# Patient Record
Sex: Female | Born: 1985 | Race: Black or African American | Hispanic: No | Marital: Married | Smoking: Never smoker
Health system: Southern US, Community
[De-identification: ages and names within clinical notes are randomized; demographics above are authoritative.]

## PROBLEM LIST (undated history)

## (undated) DIAGNOSIS — Z87440 Personal history of urinary (tract) infections: Secondary | ICD-10-CM

## (undated) DIAGNOSIS — I10 Essential (primary) hypertension: Secondary | ICD-10-CM

## (undated) DIAGNOSIS — Z87448 Personal history of other diseases of urinary system: Secondary | ICD-10-CM

## (undated) DIAGNOSIS — Z8619 Personal history of other infectious and parasitic diseases: Secondary | ICD-10-CM

## (undated) DIAGNOSIS — IMO0002 Reserved for concepts with insufficient information to code with codable children: Secondary | ICD-10-CM

## (undated) DIAGNOSIS — K219 Gastro-esophageal reflux disease without esophagitis: Secondary | ICD-10-CM

## (undated) HISTORY — DX: Personal history of urinary (tract) infections: Z87.440

## (undated) HISTORY — PX: TUBAL LIGATION: SHX77

## (undated) HISTORY — DX: Personal history of other diseases of urinary system: Z87.448

## (undated) HISTORY — DX: Personal history of other infectious and parasitic diseases: Z86.19

## (undated) HISTORY — DX: Reserved for concepts with insufficient information to code with codable children: IMO0002

## (undated) HISTORY — DX: Essential (primary) hypertension: I10

## (undated) HISTORY — DX: Gastro-esophageal reflux disease without esophagitis: K21.9

---

## 2003-05-20 ENCOUNTER — Other Ambulatory Visit: Admission: RE | Admit: 2003-05-20 | Discharge: 2003-05-20 | Payer: Self-pay | Admitting: *Deleted

## 2004-03-26 ENCOUNTER — Emergency Department (HOSPITAL_COMMUNITY): Admission: EM | Admit: 2004-03-26 | Discharge: 2004-03-26 | Payer: Self-pay | Admitting: Emergency Medicine

## 2004-07-19 ENCOUNTER — Other Ambulatory Visit: Admission: RE | Admit: 2004-07-19 | Discharge: 2004-07-19 | Payer: Self-pay | Admitting: Obstetrics and Gynecology

## 2004-10-24 ENCOUNTER — Inpatient Hospital Stay (HOSPITAL_COMMUNITY): Admission: AD | Admit: 2004-10-24 | Discharge: 2004-10-29 | Payer: Self-pay | Admitting: Obstetrics and Gynecology

## 2005-02-07 ENCOUNTER — Inpatient Hospital Stay (HOSPITAL_COMMUNITY): Admission: AD | Admit: 2005-02-07 | Discharge: 2005-02-10 | Payer: Self-pay | Admitting: Obstetrics and Gynecology

## 2005-10-06 ENCOUNTER — Emergency Department: Payer: Self-pay | Admitting: Emergency Medicine

## 2006-02-09 ENCOUNTER — Other Ambulatory Visit: Admission: RE | Admit: 2006-02-09 | Discharge: 2006-02-09 | Payer: Self-pay | Admitting: Obstetrics and Gynecology

## 2006-08-09 ENCOUNTER — Inpatient Hospital Stay (HOSPITAL_COMMUNITY): Admission: AD | Admit: 2006-08-09 | Discharge: 2006-08-12 | Payer: Self-pay | Admitting: Obstetrics and Gynecology

## 2009-04-26 ENCOUNTER — Emergency Department: Payer: Self-pay | Admitting: Unknown Physician Specialty

## 2009-12-04 ENCOUNTER — Encounter (INDEPENDENT_AMBULATORY_CARE_PROVIDER_SITE_OTHER): Payer: Self-pay | Admitting: Obstetrics and Gynecology

## 2009-12-04 ENCOUNTER — Inpatient Hospital Stay (HOSPITAL_COMMUNITY): Admission: AD | Admit: 2009-12-04 | Discharge: 2009-12-06 | Payer: Self-pay | Admitting: Obstetrics and Gynecology

## 2010-07-01 LAB — CBC
HCT: 19.2 % — ABNORMAL LOW (ref 36.0–46.0)
HCT: 20.2 % — ABNORMAL LOW (ref 36.0–46.0)
HCT: 34.9 % — ABNORMAL LOW (ref 36.0–46.0)
Hemoglobin: 11.7 g/dL — ABNORMAL LOW (ref 12.0–15.0)
Hemoglobin: 6.9 g/dL — CL (ref 12.0–15.0)
MCH: 31.6 pg (ref 26.0–34.0)
MCH: 31.8 pg (ref 26.0–34.0)
MCH: 32.2 pg (ref 26.0–34.0)
MCHC: 33.6 g/dL (ref 30.0–36.0)
MCHC: 33.6 g/dL (ref 30.0–36.0)
MCHC: 34.1 g/dL (ref 30.0–36.0)
MCV: 93.9 fL (ref 78.0–100.0)
MCV: 94.5 fL (ref 78.0–100.0)
MCV: 94.6 fL (ref 78.0–100.0)
Platelets: 181 10*3/uL (ref 150–400)
RBC: 2.03 MIL/uL — ABNORMAL LOW (ref 3.87–5.11)
RBC: 2.14 MIL/uL — ABNORMAL LOW (ref 3.87–5.11)
RBC: 3.71 MIL/uL — ABNORMAL LOW (ref 3.87–5.11)
RDW: 13.6 % (ref 11.5–15.5)
RDW: 13.9 % (ref 11.5–15.5)
RDW: 14.2 % (ref 11.5–15.5)
WBC: 10.3 10*3/uL (ref 4.0–10.5)
WBC: 12.8 10*3/uL — ABNORMAL HIGH (ref 4.0–10.5)

## 2010-07-01 LAB — COMPREHENSIVE METABOLIC PANEL
ALT: 21 U/L (ref 0–35)
AST: 19 U/L (ref 0–37)
Albumin: 3.4 g/dL — ABNORMAL LOW (ref 3.5–5.2)
Alkaline Phosphatase: 94 U/L (ref 39–117)
CO2: 23 mEq/L (ref 19–32)
Calcium: 9.3 mg/dL (ref 8.4–10.5)
Chloride: 104 mEq/L (ref 96–112)
Creatinine, Ser: 0.65 mg/dL (ref 0.4–1.2)
GFR calc Af Amer: >60 mL/min (ref >60)
Potassium: 4 mEq/L (ref 3.5–5.1)
Sodium: 135 mEq/L (ref 135–145)
Total Protein: 6.3 g/dL (ref 6.0–8.3)

## 2010-07-01 LAB — URINALYSIS, DIPSTICK ONLY
Bilirubin Urine: NEGATIVE
Glucose, UA: NEGATIVE mg/dL
Hgb urine dipstick: NEGATIVE
Ketones, ur: NEGATIVE mg/dL
Leukocytes, UA: NEGATIVE
Nitrite: NEGATIVE
Specific Gravity, Urine: 1.015 (ref 1.005–1.030)
Urobilinogen, UA: 0.2 mg/dL (ref 0.0–1.0)
pH: 6 (ref 5.0–8.0)

## 2010-07-01 LAB — URIC ACID: Uric Acid, Serum: 4.9 mg/dL (ref 2.4–7.0)
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
## 2010-09-02 NOTE — Discharge Summary (Signed)
Candice, Gonzalez              ACCOUNT NO.:  [id number]   MEDICAL RECORD NO.:  [id number]          PATIENT TYPE:  INP   LOCATION:  9160                          [facility]   PHYSICIAN:  Osborn Coho, M.D.   DATE OF BIRTH:  1985/06/02   DATE OF ADMISSION:  10/24/2004  DATE OF DISCHARGE:  10/29/2004                                 DISCHARGE SUMMARY   ADMISSION DIAGNOSIS:  1.  Intrauterine pregnancy at 24 weeks.  2.  Pyelonephritis.   DISCHARGE DIAGNOSES:  1.  Intrauterine pregnancy at 24 weeks.  2.  Pyelonephritis.   HOSPITAL COURSE:  Candice Gonzalez is a 25 year old gravida 1, para 0, who was  admitted at 24-1/7 weeks secondary to pyelonephritis. The patient had a  history of urinary tract infections this pregnancy and pyelonephritis prior  to the pregnancy. She was evaluated in the office on October 24, 2004 with  nausea, vomiting and back pain. Her urinalysis from the office showed  positive nitrites. Her temperature at home was 103. She had an elevated  blood cell count and back pain on the right. Therefore was admitted for IV  antibiotics, hydration, control of nausea and vomiting and further  observation. Her pregnancy had been followed by [facility]  service and was remarkable for:  1.  History of pyelonephritis before pregnancy.  2.  Severe nausea in early pregnancy.  3.  Urinary tract infection first trimester with patient placed on      suppressive therapy but she did not complete the full course of care.   Temperature upon admission to maternal admissions was 100.2. Blood cultures  were drawn. Urine culture and sensitivity was sent from [facility] on October 24, 2004. The patient was given Tylenol upon admission. Her  highest temperature during admission was 103.5. She was placed on a cooling  blanket. Fetal fimbriectomy was obtained on hospital day number one. On the  day of admission her laboratories revealed a white blood count 22.2  thousand, hemoglobin 11.8. On hospital day number one her white blood cell  count was 25.5 thousand and hemoglobin was 10.5. The patient was having some  irregular contractions on the monitor. Fetal heart rate was reassuring and  fetal fibronectin result was negative. By hospital day number two her white  blood count was 18.8 thousand and hemoglobin was 9.6. She remained on the  cooling blanket periodically, temperature continued to spike to 103. The  patient was continued on IV Ancef throughout her hospital stay. Occasionally  she had fetal tachycardia to the 170's.  During the night of July 13 her maximum temperature was 101.4. Urine culture  results: greater than 100,000 colonies of E. coli sensitive to Ancef. The  patient continued with intermittent back pain. By hospital day number four  she was feeling better. Positive nausea but no vomiting. At 6 a.m. on  hospital day number five she had a temperature of 100.5. White blood count  was down to 7.1 thousand and hemoglobin was 9.5. By hospital day number six  she had remained afebrile for greater than 24 hours. She was  feeling much  better. Fetal heart rate had periods of reactivity. There was no growth on  her blood cultures. The patient was deemed to have received the full benefit  of her hospital stay and she was discharged home.   DISCHARGE INSTRUCTIONS:  Preterm labor instructions were reviewed with the  patient. Perinatal hygiene as well as increased water intake were  encouraged.   DISCHARGE MEDICATIONS:  1.  Keflex 500 mg q.i.d. for a total of 14 days including the days that she      received Ancef in the hospital.  2.  Prenatal vitamin one p.o. daily.  3.  Macrodantin 100 mg q.h.s. upon completion of her Keflex for urinary      tract infection suppression therapy.   FOLLOW UP:  Discharge follow up will be occur at [facility] in  1-2 weeks or p.r.n.       KS/MEDQ  D:  10/29/2004  T:  10/29/2004  Job:   [id number]

## 2011-11-22 ENCOUNTER — Ambulatory Visit (INDEPENDENT_AMBULATORY_CARE_PROVIDER_SITE_OTHER): Payer: BC Managed Care – PPO | Admitting: Obstetrics and Gynecology

## 2011-11-22 ENCOUNTER — Encounter: Payer: Self-pay | Admitting: Obstetrics and Gynecology

## 2011-11-22 VITALS — BP 112/80 | Resp 16 | Wt 115.0 lb

## 2011-11-22 DIAGNOSIS — Z124 Encounter for screening for malignant neoplasm of cervix: Secondary | ICD-10-CM

## 2011-11-22 NOTE — Progress Notes (Signed)
Subjective:    Candice Gonzalez is a 26 y.o. female, G3P3003, who presents for an annual exam. Using condoms for birth control, not planning any more pregnancies. Not exercising, has vitamins not taking them. Is a Runner, broadcasting/film/video.    History   Social History  . Marital Status: Married    Spouse Name: N/A    Number of Children: N/A  . Years of Education: N/A   Social History Main Topics  . Smoking status: Never Smoker   . Smokeless tobacco: Never Used  . Alcohol Use: No  . Drug Use: No  . Sexually Active: Yes -- Female partner(s)    Birth Control/ Protection: Condom, Coitus interruptus     BTL   Other Topics Concern  . None   Social History Narrative  . None   Fm hx Grandmother died of breast cancer Menstrual cycle:   LMP: Patient's last menstrual period was 11/09/2011.           Cycle: regular not heavy  The following portions of the patient's history were reviewed and updated as appropriate: allergies, current medications, past family history, past medical history, past social history, past surgical history and problem list.  Review of Systems Pertinent items are noted in HPI. Breast:Negative for breast lump,nipple discharge or nipple retraction Gastrointestinal: Negative for abdominal pain, change in bowel habits or rectal bleeding Urinary:negative   Objective:    BP 112/80  Resp 16  Wt 115 lb (52.164 kg)  LMP 11/09/2011  Breastfeeding? No    Weight:  Wt Readings from Last 1 Encounters:  11/22/11 115 lb (52.164 kg)          BMI: There is no height on file to calculate BMI.  General Appearance: Alert, appropriate appearance for age. No acute distress HEENT: Grossly normal Neck / Thyroid: Supple, no masses, nodes or enlargement Lungs: clear to auscultation bilaterally Back: No CVA tenderness Breast Exam: No dimpling, nipple retraction or discharge. No masses or nodes. and No masses or nodes.No dimpling, nipple retraction or discharge. Cardiovascular: Regular rate and  rhythm. S1, S2, no murmur Gastrointestinal: Soft, non-tender, no masses or organomegaly Pelvic Exam: Vulva and vagina appear normal. Bimanual exam reveals normal uterus and adnexa. External genitalia: EGBUS WNL Cervix: normal appearance Adnexa: no masses Clinical staff offered to be present for exam: TG  Initials: TG yes present Rectovaginal: rectum visible wnl Lymphatic Exam: Non-palpable nodes in neck, clavicular, axillary, or inguinal regions Skin: no rash or abnormalities Neurologic: Normal gait and speech, no tremor  Psychiatric: Alert and oriented, appropriate affect.  Declined STD testing  Assessment:    Normal gyn exam    Plan:    pap smear sent return annually or prn STD screening: discussed Contraception:condoms, self breast exam, BRACI with fm hx, discussed routine health maintence exercise, vitamins, nutrition.  Lavera Guise, CNM

## 2011-11-22 NOTE — Progress Notes (Signed)
Last Pap: 06/2009 WNL: Yes Regular Periods:yes Contraception: CONDOMS  Monthly Breast exam:no Tetanus<76yrs:yes Nl.Bladder Function:yes Daily BMs:yes Healthy Diet:yes Calcium:no Mammogram:no Date of Mammogram: 2004-2005 ? Inflamed lymph node ? If it was breast ultrasound. Exercise:no Have often Exercise: none Seatbelt: yes Abuse at home: no Stressful work:yes Sigmoid-colonoscopy: never Bone Density: No PCP: dr. Tomasa Rand Change in PMH: no change Change in [facility]: no change  No complaints.

## 2011-11-23 LAB — PAP IG W/ RFLX HPV ASCU

## 2012-08-06 ENCOUNTER — Other Ambulatory Visit: Payer: Self-pay | Admitting: Obstetrics and Gynecology

## 2012-08-06 ENCOUNTER — Other Ambulatory Visit (HOSPITAL_COMMUNITY): Payer: Self-pay | Admitting: Obstetrics and Gynecology
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
## 2012-08-07 ENCOUNTER — Encounter (HOSPITAL_COMMUNITY): Payer: Self-pay

## 2012-08-07 ENCOUNTER — Encounter (HOSPITAL_COMMUNITY)
Admission: RE | Admit: 2012-08-07 | Discharge: 2012-08-07 | Disposition: A | Discharge status: Home / Self Care | Payer: BC Managed Care – PPO | Source: Ambulatory Visit | Attending: Obstetrics and Gynecology | Admitting: Obstetrics and Gynecology

## 2012-08-07 ENCOUNTER — Encounter (HOSPITAL_COMMUNITY): Payer: Self-pay | Admitting: Pharmacy Technician

## 2012-08-07 LAB — CBC
HCT: 41.9 % (ref 36.0–46.0)
Hemoglobin: 13.5 g/dL (ref 12.0–15.0)
MCH: 29.7 pg (ref 26.0–34.0)
MCV: 92.3 fL (ref 78.0–100.0)
Platelets: 264 10*3/uL (ref 150–400)
RBC: 4.54 MIL/uL (ref 3.87–5.11)
RDW: 13.5 % (ref 11.5–15.5)
WBC: 7.4 10*3/uL (ref 4.0–10.5)

## 2012-08-07 LAB — SURGICAL PCR SCREEN
MRSA, PCR: NEGATIVE
Staphylococcus aureus: NEGATIVE

## 2012-08-07 NOTE — Patient Instructions (Signed)
   Your procedure is scheduled on:0945  Enter through the Main Entrance of [facility] at: Pick up the phone at the desk and dial [phone] and inform us of your arrival.  Please call this number if you have any problems the morning of surgery: [phone]  Remember: Do not eat food after midnight: tues after midnight Do not drink clear liquids after:tues after midnight Take these medicines the morning of surgery with a SIP OF WATER:  Do not wear jewelry, make-up, or FINGER nail polish No metal in your hair or on your body. Do not wear lotions, powders, perfumes. You may wear deodorant.  Please use your CHG wash as directed prior to surgery.  Do not shave anywhere for at least 12 hours prior to first CHG shower.  Do not bring valuables to the hospital. Contacts, dentures or bridgework may not be worn into surgery.  Leave suitcase in the car. After Surgery it may be brought to your room. For patients being admitted to the hospital, checkout time is 11:00am the day of discharge.  Patients discharged on the day of surgery will not be allowed to drive home.

## 2012-08-08 ENCOUNTER — Encounter (HOSPITAL_COMMUNITY): Payer: Self-pay | Admitting: Registered Nurse

## 2012-08-08 ENCOUNTER — Encounter (HOSPITAL_COMMUNITY)
Admission: RE | Disposition: A | Discharge status: Home / Self Care | Payer: Self-pay | Source: Ambulatory Visit | Attending: Obstetrics and Gynecology

## 2012-08-08 ENCOUNTER — Encounter (HOSPITAL_COMMUNITY): Payer: Self-pay | Admitting: Anesthesiology

## 2012-08-08 ENCOUNTER — Ambulatory Visit (HOSPITAL_COMMUNITY): Payer: BC Managed Care – PPO | Admitting: Anesthesiology

## 2012-08-08 ENCOUNTER — Ambulatory Visit (HOSPITAL_COMMUNITY)
Admission: RE | Admit: 2012-08-08 | Discharge: 2012-08-08 | Disposition: A | Discharge status: Home / Self Care | Payer: BC Managed Care – PPO | Source: Ambulatory Visit | Attending: Obstetrics and Gynecology | Admitting: Obstetrics and Gynecology

## 2012-08-08 DIAGNOSIS — T8339XA Other mechanical complication of intrauterine contraceptive device, initial encounter: Secondary | ICD-10-CM | POA: Insufficient documentation

## 2012-08-08 DIAGNOSIS — X58XXXA Exposure to other specified factors, initial encounter: Secondary | ICD-10-CM | POA: Insufficient documentation

## 2012-08-08 HISTORY — PX: IUD REMOVAL: SHX5392

## 2012-08-08 HISTORY — PX: LAPAROSCOPY: SHX197

## 2012-08-08 LAB — PREGNANCY, URINE: Preg Test, Ur: NEGATIVE

## 2012-08-08 SURGERY — LAPAROSCOPY OPERATIVE
Anesthesia: General | Site: Abdomen | Wound class: Clean Contaminated

## 2012-08-08 MED ORDER — GLYCOPYRROLATE 0.2 MG/ML IJ SOLN
INTRAMUSCULAR | Status: DC | PRN
  Administered 2012-08-08: 0.4 mg via INTRAVENOUS

## 2012-08-08 MED ORDER — MIDAZOLAM HCL 2 MG/2ML IJ SOLN
INTRAMUSCULAR | Status: AC
  Filled 2012-08-08: qty 2

## 2012-08-08 MED ORDER — MIDAZOLAM HCL 5 MG/5ML IJ SOLN
INTRAMUSCULAR | Status: DC | PRN
  Administered 2012-08-08: 2 mg via INTRAVENOUS

## 2012-08-08 MED ORDER — IBUPROFEN 600 MG PO TABS: 600.0000 mg | ORAL_TABLET | Freq: Four times a day (QID) | ORAL | Status: DC | PRN

## 2012-08-08 MED ORDER — NEOSTIGMINE METHYLSULFATE 1 MG/ML IJ SOLN
INTRAMUSCULAR | Status: DC | PRN
  Administered 2012-08-08: 3 mg via INTRAVENOUS

## 2012-08-08 MED ORDER — NEOSTIGMINE METHYLSULFATE 1 MG/ML IJ SOLN
INTRAMUSCULAR | Status: AC
  Filled 2012-08-08: qty 1

## 2012-08-08 MED ORDER — METOCLOPRAMIDE HCL 5 MG/ML IJ SOLN
INTRAMUSCULAR | Status: AC
  Administered 2012-08-08: 10 mg via INTRAVENOUS
  Filled 2012-08-08: qty 2

## 2012-08-08 MED ORDER — PROMETHAZINE HCL 12.5 MG PO TABS: 12.5000 mg | ORAL_TABLET | Freq: Four times a day (QID) | ORAL | Status: DC | PRN

## 2012-08-08 MED ORDER — DOXYCYCLINE HYCLATE 100 MG PO CAPS: 100.0000 mg | ORAL_CAPSULE | Freq: Two times a day (BID) | ORAL | Status: DC

## 2012-08-08 MED ORDER — DEXAMETHASONE SODIUM PHOSPHATE 10 MG/ML IJ SOLN
INTRAMUSCULAR | Status: DC | PRN
  Administered 2012-08-08: 10 mg via INTRAVENOUS

## 2012-08-08 MED ORDER — LIDOCAINE HCL (CARDIAC) 20 MG/ML IV SOLN
INTRAVENOUS | Status: DC | PRN
  Administered 2012-08-08: 40 mg via INTRAVENOUS

## 2012-08-08 MED ORDER — KETOROLAC TROMETHAMINE 30 MG/ML IJ SOLN
INTRAMUSCULAR | Status: DC | PRN
  Administered 2012-08-08: 30 mg via INTRAVENOUS

## 2012-08-08 MED ORDER — LACTATED RINGERS IV SOLN
INTRAVENOUS | Status: DC
  Administered 2012-08-08: 09:00:00 via INTRAVENOUS

## 2012-08-08 MED ORDER — LIDOCAINE HCL (CARDIAC) 20 MG/ML IV SOLN
INTRAVENOUS | Status: AC
  Filled 2012-08-08: qty 5

## 2012-08-08 MED ORDER — PROPOFOL 10 MG/ML IV BOLUS
INTRAVENOUS | Status: DC | PRN
  Administered 2012-08-08: 150 mg via INTRAVENOUS

## 2012-08-08 MED ORDER — FENTANYL CITRATE 0.05 MG/ML IJ SOLN: 25.0000 ug | INTRAMUSCULAR | Status: DC | PRN

## 2012-08-08 MED ORDER — DEXAMETHASONE SODIUM PHOSPHATE 10 MG/ML IJ SOLN
INTRAMUSCULAR | Status: AC
  Filled 2012-08-08: qty 1

## 2012-08-08 MED ORDER — ONDANSETRON HCL 4 MG/2ML IJ SOLN
INTRAMUSCULAR | Status: DC | PRN
  Administered 2012-08-08: 4 mg via INTRAVENOUS

## 2012-08-08 MED ORDER — FENTANYL CITRATE 0.05 MG/ML IJ SOLN
INTRAMUSCULAR | Status: AC
  Filled 2012-08-08: qty 5

## 2012-08-08 MED ORDER — METOCLOPRAMIDE HCL 5 MG/ML IJ SOLN: 10.0000 mg | Freq: Once | INTRAMUSCULAR | Status: AC | PRN

## 2012-08-08 MED ORDER — KETOROLAC TROMETHAMINE 30 MG/ML IJ SOLN
INTRAMUSCULAR | Status: AC
  Filled 2012-08-08: qty 1

## 2012-08-08 MED ORDER — FENTANYL CITRATE 0.05 MG/ML IJ SOLN
INTRAMUSCULAR | Status: DC | PRN
  Administered 2012-08-08: 100 ug via INTRAVENOUS

## 2012-08-08 MED ORDER — ROCURONIUM BROMIDE 50 MG/5ML IV SOLN
INTRAVENOUS | Status: AC
  Filled 2012-08-08: qty 1

## 2012-08-08 MED ORDER — FENTANYL CITRATE 0.05 MG/ML IJ SOLN
INTRAMUSCULAR | Status: AC
  Administered 2012-08-08: 25 ug via INTRAVENOUS
  Filled 2012-08-08: qty 2

## 2012-08-08 MED ORDER — HYDROCODONE-ACETAMINOPHEN 2.5-325 MG PO TABS: 1.0000 | ORAL_TABLET | Freq: Four times a day (QID) | ORAL | Status: DC | PRN

## 2012-08-08 MED ORDER — ONDANSETRON HCL 4 MG/2ML IJ SOLN
INTRAMUSCULAR | Status: AC
  Filled 2012-08-08: qty 2

## 2012-08-08 MED ORDER — PROPOFOL 10 MG/ML IV EMUL
INTRAVENOUS | Status: AC
  Filled 2012-08-08: qty 20

## 2012-08-08 MED ORDER — GLYCOPYRROLATE 0.2 MG/ML IJ SOLN
INTRAMUSCULAR | Status: AC
  Filled 2012-08-08: qty 2

## 2012-08-08 MED ORDER — ROCURONIUM BROMIDE 100 MG/10ML IV SOLN
INTRAVENOUS | Status: DC | PRN
  Administered 2012-08-08: 35 mg via INTRAVENOUS

## 2012-08-08 MED ORDER — BUPIVACAINE-EPINEPHRINE PF 0.25-1:200000 % IJ SOLN
INTRAMUSCULAR | Status: AC
  Filled 2012-08-08: qty 30

## 2012-08-08 MED ORDER — BUPIVACAINE-EPINEPHRINE 0.25% -1:200000 IJ SOLN
INTRAMUSCULAR | Status: DC | PRN
  Administered 2012-08-08: 5 mL

## 2012-08-08 SURGICAL SUPPLY — 36 items
ADH SKN CLS APL DERMABOND .7 (GAUZE/BANDAGES/DRESSINGS) ×1
BAG SPEC RTRVL LRG 6X4 10 (ENDOMECHANICALS)
BARRIER ADHS 3X4 INTERCEED (GAUZE/BANDAGES/DRESSINGS) IMPLANT
BRR ADH 4X3 ABS CNTRL BYND (GAUZE/BANDAGES/DRESSINGS)
CABLE HIGH FREQUENCY MONO STRZ (ELECTRODE) IMPLANT
CHLORAPREP W/TINT 26ML (MISCELLANEOUS) ×2 IMPLANT
CLOTH BEACON ORANGE TIMEOUT ST (SAFETY) ×2 IMPLANT
DERMABOND ADVANCED (GAUZE/BANDAGES/DRESSINGS) ×1
DERMABOND ADVANCED .7 DNX12 (GAUZE/BANDAGES/DRESSINGS) ×1 IMPLANT
DRSG VASELINE 3X18 (GAUZE/BANDAGES/DRESSINGS) IMPLANT
FORCEPS CUTTING 33CM 5MM (CUTTING FORCEPS) IMPLANT
FORCEPS CUTTING 45CM 5MM (CUTTING FORCEPS) IMPLANT
GLOVE BIO SURGEON STRL SZ 6.5 (GLOVE) ×2 IMPLANT
GLOVE BIOGEL PI IND STRL 7.0 (GLOVE) ×1 IMPLANT
GLOVE BIOGEL PI INDICATOR 7.0 (GLOVE) ×1
GOWN PREVENTION PLUS LG XLONG (DISPOSABLE) ×6 IMPLANT
MANIPULATOR UTERINE 4.5 ZUMI (MISCELLANEOUS) IMPLANT
NS IRRIG 1000ML POUR BTL (IV SOLUTION) ×2 IMPLANT
PACK LAPAROSCOPY BASIN (CUSTOM PROCEDURE TRAY) ×2 IMPLANT
POUCH SPECIMEN RETRIEVAL 10MM (ENDOMECHANICALS) IMPLANT
PROTECTOR NERVE ULNAR (MISCELLANEOUS) ×2 IMPLANT
SCALPEL HARMONIC ACE (MISCELLANEOUS) ×2 IMPLANT
SCISSORS LAP 5X35 DISP (ENDOMECHANICALS) ×1 IMPLANT
SET IRRIG TUBING LAPAROSCOPIC (IRRIGATION / IRRIGATOR) IMPLANT
SOLUTION ELECTROLUBE (MISCELLANEOUS) IMPLANT
SPONGE SURGIFOAM ABS GEL 12-7 (HEMOSTASIS) ×1 IMPLANT
SUT MNCRL AB 3-0 PS2 27 (SUTURE) ×2 IMPLANT
SUT VICRYL 0 ENDOLOOP (SUTURE) IMPLANT
SUT VICRYL 0 UR6 27IN ABS (SUTURE) ×4 IMPLANT
SYR 50ML LL SCALE MARK (SYRINGE) ×1 IMPLANT
TOWEL OR 17X24 6PK STRL BLUE (TOWEL DISPOSABLE) ×4 IMPLANT
TRAY FOLEY CATH 14FR (SET/KITS/TRAYS/PACK) ×2 IMPLANT
TROCAR BALLN 12MMX100 BLUNT (TROCAR) ×2 IMPLANT
TROCAR XCEL NON-BLD 5MMX100MML (ENDOMECHANICALS) ×4 IMPLANT
WARMER LAPAROSCOPE (MISCELLANEOUS) ×2 IMPLANT
WATER STERILE IRR 1000ML POUR (IV SOLUTION) ×2 IMPLANT

## 2012-08-08 NOTE — Preoperative (Signed)
Beta Blockers   Reason not to administer Beta Blockers:Not Applicable 

## 2012-08-08 NOTE — Op Note (Signed)
Diagnostic Laparoscopy Procedure Note  Indications: The patient is a 27 y.o. female with retained IUD for six years.  Pre-operative Diagnosis: intraabdominal IUD  Post-operative Diagnosis: same  Surgeon: WJXBJYN,WGNFA A   Assistants: E. Lowell Guitar PA  Anesthesia: General endotracheal anesthesia and Local anesthesia .25 % marcaine with epinephrine.    ASA Class: per anesthesia  Procedure Details  The patient was seen in the Holding Room. The risks, benefits, complications, treatment options, and expected outcomes were discussed with the patient. The possibilities of reaction to medication, pulmonary aspiration, perforation of viscus, bleeding, recurrent infection, the need for additional procedures, failure to diagnose a condition, and creating a complication requiring transfusion or operation were discussed with the patient. The patient concurred with the proposed plan, giving informed consent. The patient was taken to the Operating Room, identified as Candice Gonzalez and the procedure verified as Diagnostic Laparoscopy. A Time Out was held and the above information confirmed.  After induction of general anesthesia, the patient was placed in modified dorsal lithotomy position where she was prepped, draped, and catheterized in the normal, sterile fashion.  The cervix was visualized and an intrauterine manipulator was placed. A 1 cm umbilical incision was then performed. And carried down to the fascia.  Fascia was incised with a knife.  The peritoneum was entered with a hemostat. The fascial incision was was extended with mayo scissors.  A circumferential suture was placed around the fascia and anchored to the hasson once placed in the abdomen.  The patient had normal appearing liver, uterus, tubes and ovaries.  She had a peritoneal window in the posterior fornix and along the right pelvic side wall.  The IUD was identified along the left pelvic side wall. It was imbedded in the peritoneum.  It was  right over the left ureter, however the IUD was superficial. Two five millimeter trocars placed in the right and left lower quadrant of the abdomen  The IUD was then dissected out of the peritoneum.  And removed from the abdomen.  There was no damage to the adjacent ureter or bowel.  There was scant bleeding on the pelvic side wall. This area was irrigated and made hemostatic with gel foam.   Both 5 mm trocars were removed undr visualization.   Following the procedure the umbilical hasson was removed after intra-abdominal carbon dioxide was expressed. The incision was closed with subcutaneous and subcuticular sutures of 4-0 Vicryl. The intrauterine manipulator was then removed.  Instrument, sponge, and needle counts were correct prior to abdominal closure and at the conclusion of the case.   Findings: The anterior cul-de-sac and round ligaments normal The uterus normal The adnexa normal Cul-de-sac peritoneal windows seen  Estimated Blood Loss:  Minimal         Drains: none         Total IV Fluids:         Specimens: IUD              Complications:  None; patient tolerated the procedure well.         Disposition: PACU - hemodynamically stable.         Condition: stable
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
## 2012-08-08 NOTE — Anesthesia Postprocedure Evaluation (Signed)
  Anesthesia Post-op Note  Anesthesia Post Note  Patient: Candice Gonzalez  Procedure(s) Performed: Procedure(s) (LRB): LAPAROSCOPY OPERATIVE (N/A) INTRAUTERINE DEVICE (IUD) REMOVAL (N/A)  Anesthesia type: General  Patient location: PACU  Post pain: Pain level controlled  Post assessment: Post-op Vital signs reviewed  Last Vitals:  Filed Vitals:   08/08/12 1411  BP:   Pulse:   Temp: 36.4 C  Resp:     Post vital signs: Reviewed  Level of consciousness: sedated  Complications: No apparent anesthesia complications

## 2012-08-08 NOTE — Anesthesia Preprocedure Evaluation (Addendum)

## 2012-08-08 NOTE — Transfer of Care (Signed)
Immediate Anesthesia Transfer of Care Note  Patient: Candice Gonzalez  Procedure(s) Performed: Procedure(s): LAPAROSCOPY OPERATIVE (N/A) INTRAUTERINE DEVICE (IUD) REMOVAL (N/A)  Patient Location: PACU  Anesthesia Type:General  Level of Consciousness: sedated  Airway & Oxygen Therapy: Patient Spontanous Breathing and Patient connected to nasal cannula oxygen  Post-op Assessment: Report given to PACU RN  Post vital signs: Reviewed  Complications: No apparent anesthesia complications

## 2012-08-10 ENCOUNTER — Encounter (HOSPITAL_COMMUNITY): Payer: Self-pay | Admitting: Obstetrics and Gynecology

## 2012-08-13 ENCOUNTER — Encounter: Payer: Self-pay | Admitting: Obstetrics and Gynecology

## 2012-08-15 ENCOUNTER — Telehealth: Payer: Self-pay | Admitting: *Deleted

## 2012-08-15 ENCOUNTER — Encounter: Payer: Self-pay | Admitting: *Deleted

## 2012-08-15 NOTE — Telephone Encounter (Signed)
Pt left message on 4/29 stating she had surgery on 4/24 and requesting letter to be faxed to her employer stating that she may return to work on 08/19/12. She was given a written note by MD on prescription pad but states her employer requires letter on letterhead. Upon review of medical record this RN observed that pt had surgery performed by Dr. Normand Sloop and thus is not a pt from this practice. I called pt and informed her of need to contact [facility] in order to complete her request. Pt stated that she had done that however it was taking them a long time to fax the letter. She then stated that the letter was faxed yesterday. Pt had additional questions about her surgery and I advised her to contact her doctor's office. Pt voiced understanding.

## 2012-08-21 LAB — WOUND CULTURE

## 2012-08-21 LAB — ANAEROBIC CULTURE: Gram Stain: NONE SEEN

## 2014-02-16 ENCOUNTER — Encounter (HOSPITAL_COMMUNITY): Payer: Self-pay | Admitting: Obstetrics and Gynecology

## 2015-10-13 ENCOUNTER — Other Ambulatory Visit: Payer: Self-pay | Admitting: Obstetrics and Gynecology

## 2015-10-13 DIAGNOSIS — Z1231 Encounter for screening mammogram for malignant neoplasm of breast: Secondary | ICD-10-CM

## 2015-10-15 DIAGNOSIS — Z803 Family history of malignant neoplasm of breast: Secondary | ICD-10-CM | POA: Insufficient documentation

## 2015-10-21 ENCOUNTER — Ambulatory Visit
Admission: RE | Admit: 2015-10-21 | Discharge: 2015-10-21 | Disposition: A | Discharge status: Home / Self Care | Payer: BLUE CROSS/BLUE SHIELD | Source: Ambulatory Visit | Attending: Obstetrics and Gynecology | Admitting: Obstetrics and Gynecology

## 2015-10-21 DIAGNOSIS — Z1231 Encounter for screening mammogram for malignant neoplasm of breast: Secondary | ICD-10-CM

## 2016-04-14 ENCOUNTER — Encounter: Payer: Self-pay | Admitting: Emergency Medicine

## 2016-04-14 ENCOUNTER — Emergency Department: Payer: BLUE CROSS/BLUE SHIELD

## 2016-04-14 ENCOUNTER — Emergency Department
Admission: EM | Admit: 2016-04-14 | Discharge: 2016-04-14 | Disposition: A | Discharge status: Home / Self Care | Payer: BLUE CROSS/BLUE SHIELD | Attending: Emergency Medicine | Admitting: Emergency Medicine

## 2016-04-14 DIAGNOSIS — I10 Essential (primary) hypertension: Secondary | ICD-10-CM | POA: Diagnosis not present

## 2016-04-14 DIAGNOSIS — R1031 Right lower quadrant pain: Secondary | ICD-10-CM | POA: Diagnosis not present

## 2016-04-14 DIAGNOSIS — R112 Nausea with vomiting, unspecified: Secondary | ICD-10-CM | POA: Insufficient documentation

## 2016-04-14 DIAGNOSIS — R197 Diarrhea, unspecified: Secondary | ICD-10-CM | POA: Diagnosis present

## 2016-04-14 LAB — COMPREHENSIVE METABOLIC PANEL
ALBUMIN: 5.1 g/dL — AB (ref 3.5–5.0)
ALK PHOS: 64 U/L (ref 38–126)
ALT: 26 U/L (ref 14–54)
AST: 31 U/L (ref 15–41)
Anion gap: 11 (ref 5–15)
BILIRUBIN TOTAL: 1.1 mg/dL (ref 0.3–1.2)
BUN: 21 mg/dL — AB (ref 6–20)
CALCIUM: 9.7 mg/dL (ref 8.9–10.3)
CO2: 25 mmol/L (ref 22–32)
CREATININE: 0.85 mg/dL (ref 0.44–1.00)
Chloride: 104 mmol/L (ref 101–111)
GFR calc Af Amer: >60 mL/min (ref >60)
GLUCOSE: 151 mg/dL — AB (ref 65–99)
Potassium: 4.5 mmol/L (ref 3.5–5.1)
Sodium: 140 mmol/L (ref 135–145)
TOTAL PROTEIN: 8.5 g/dL — AB (ref 6.5–8.1)

## 2016-04-14 LAB — CBC WITH DIFFERENTIAL/PLATELET
BASOS ABS: 0 10*3/uL (ref 0–0.1)
BASOS PCT: 0 %
Eosinophils Absolute: 0 10*3/uL (ref 0–0.7)
Eosinophils Relative: 0 %
HEMATOCRIT: 45.9 % (ref 35.0–47.0)
HEMOGLOBIN: 15.5 g/dL (ref 12.0–16.0)
LYMPHS PCT: 1 %
Lymphs Abs: 0.2 10*3/uL — ABNORMAL LOW (ref 1.0–3.6)
MCH: 29.9 pg (ref 26.0–34.0)
MCHC: 33.8 g/dL (ref 32.0–36.0)
MCV: 88.6 fL (ref 80.0–100.0)
MONOS PCT: 2 %
Monocytes Absolute: 0.3 10*3/uL (ref 0.2–0.9)
NEUTROS ABS: 16.2 10*3/uL — AB (ref 1.4–6.5)
NEUTROS PCT: 97 %
Platelets: 282 10*3/uL (ref 150–440)
RBC: 5.18 MIL/uL (ref 3.80–5.20)
RDW: 14 % (ref 11.5–14.5)
WBC: 16.8 10*3/uL — ABNORMAL HIGH (ref 3.6–11.0)

## 2016-04-14 LAB — POCT PREGNANCY, URINE: Preg Test, Ur: NEGATIVE

## 2016-04-14 LAB — LIPASE, BLOOD: LIPASE: 27 U/L (ref 11–51)

## 2016-04-14 MED ORDER — IOPAMIDOL (ISOVUE-300) INJECTION 61%: 30.0000 mL | Freq: Once | INTRAVENOUS | Status: DC | PRN

## 2016-04-14 MED ORDER — ONDANSETRON 4 MG PO TBDP
4.0000 mg | ORAL_TABLET | Freq: Once | ORAL | Status: AC
  Administered 2016-04-14: 4 mg via ORAL

## 2016-04-14 MED ORDER — ONDANSETRON HCL 4 MG PO TABS: 4.0000 mg | ORAL_TABLET | Freq: Three times a day (TID) | ORAL | 0 refills | Status: DC | PRN

## 2016-04-14 MED ORDER — ONDANSETRON HCL 4 MG/2ML IJ SOLN
4.0000 mg | Freq: Once | INTRAMUSCULAR | Status: AC
  Administered 2016-04-14: 4 mg via INTRAVENOUS
  Filled 2016-04-14: qty 2

## 2016-04-14 MED ORDER — ONDANSETRON 4 MG PO TBDP
ORAL_TABLET | ORAL | Status: AC
  Administered 2016-04-14: 4 mg
  Filled 2016-04-14: qty 1

## 2016-04-14 MED ORDER — HYDROMORPHONE HCL 1 MG/ML IJ SOLN
INTRAMUSCULAR | Status: AC
  Administered 2016-04-14: 10:00:00
  Filled 2016-04-14: qty 1

## 2016-04-14 MED ORDER — DICYCLOMINE HCL 20 MG PO TABS: 20.0000 mg | ORAL_TABLET | Freq: Three times a day (TID) | ORAL | 0 refills | Status: DC | PRN

## 2016-04-14 MED ORDER — IOPAMIDOL (ISOVUE-300) INJECTION 61%
100.0000 mL | Freq: Once | INTRAVENOUS | Status: AC | PRN
  Administered 2016-04-14: 100 mL via INTRAVENOUS

## 2016-04-14 MED ORDER — HYDROMORPHONE HCL 1 MG/ML IJ SOLN
0.5000 mg | Freq: Once | INTRAMUSCULAR | Status: AC
  Administered 2016-04-14: 0.5 mg via INTRAVENOUS
  Filled 2016-04-14: qty 1

## 2016-04-14 MED ORDER — IOPAMIDOL (ISOVUE-300) INJECTION 61%
30.0000 mL | Freq: Once | INTRAVENOUS | Status: AC
  Administered 2016-04-14: 30 mL via ORAL

## 2016-04-14 MED ORDER — SODIUM CHLORIDE 0.9 % IV BOLUS (SEPSIS)
1000.0000 mL | Freq: Once | INTRAVENOUS | Status: AC
  Administered 2016-04-14: 1000 mL via INTRAVENOUS

## 2016-04-14 NOTE — Discharge Instructions (Signed)
Return to emergency department especially for a high fever, bloody diarrhea, focal abdominal pain, or any other new concerns.  Advance diet as tolerated with fluids and then crackers, bananas, applesauce, soup, etc.  Please return immediately if condition worsens. Please contact her primary physician or the physician you were given for referral. If you have any specialist physicians involved in her treatment and plan please also contact them. Thank you for using [facility].
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
## 2016-04-14 NOTE — ED Triage Notes (Signed)
Reports abd pain and n/v/d since last pm.

## 2016-11-13 DIAGNOSIS — S93401A Sprain of unspecified ligament of right ankle, initial encounter: Secondary | ICD-10-CM | POA: Insufficient documentation

## 2016-11-13 DIAGNOSIS — R739 Hyperglycemia, unspecified: Secondary | ICD-10-CM | POA: Insufficient documentation

## 2016-11-13 DIAGNOSIS — R030 Elevated blood-pressure reading, without diagnosis of hypertension: Secondary | ICD-10-CM | POA: Insufficient documentation

## 2016-11-16 DIAGNOSIS — I10 Essential (primary) hypertension: Secondary | ICD-10-CM | POA: Insufficient documentation

## 2016-11-18 DIAGNOSIS — E2609 Other primary hyperaldosteronism: Secondary | ICD-10-CM | POA: Insufficient documentation

## 2016-11-20 ENCOUNTER — Other Ambulatory Visit: Payer: Self-pay | Admitting: Internal Medicine

## 2016-11-20 DIAGNOSIS — E2609 Other primary hyperaldosteronism: Secondary | ICD-10-CM

## 2016-11-24 ENCOUNTER — Ambulatory Visit
Admission: RE | Admit: 2016-11-24 | Discharge: 2016-11-24 | Disposition: A | Discharge status: Home / Self Care | Payer: BLUE CROSS/BLUE SHIELD | Source: Ambulatory Visit | Attending: Internal Medicine | Admitting: Internal Medicine

## 2016-11-24 ENCOUNTER — Other Ambulatory Visit: Payer: Self-pay | Admitting: Internal Medicine

## 2017-02-19 ENCOUNTER — Ambulatory Visit: Payer: Self-pay | Admitting: Urology

## 2017-02-19 ENCOUNTER — Encounter: Payer: Self-pay | Admitting: Urology

## 2017-03-19 ENCOUNTER — Ambulatory Visit: Payer: BC Managed Care – PPO | Admitting: Urology

## 2017-03-19 ENCOUNTER — Encounter: Payer: Self-pay | Admitting: Urology

## 2017-03-19 VITALS — BP 154/114 | HR 96 | Ht 62.0 in | Wt 126.0 lb

## 2017-03-19 DIAGNOSIS — N920 Excessive and frequent menstruation with regular cycle: Secondary | ICD-10-CM | POA: Insufficient documentation

## 2017-03-19 DIAGNOSIS — T8339XA Other mechanical complication of intrauterine contraceptive device, initial encounter: Secondary | ICD-10-CM | POA: Insufficient documentation

## 2017-03-19 DIAGNOSIS — N3020 Other chronic cystitis without hematuria: Secondary | ICD-10-CM

## 2017-03-19 DIAGNOSIS — N390 Urinary tract infection, site not specified: Secondary | ICD-10-CM | POA: Insufficient documentation

## 2017-03-19 DIAGNOSIS — R300 Dysuria: Secondary | ICD-10-CM

## 2017-03-19 LAB — MICROSCOPIC EXAMINATION

## 2017-03-19 LAB — URINALYSIS, COMPLETE
Bilirubin, UA: NEGATIVE
Glucose, UA: NEGATIVE
Ketones, UA: NEGATIVE
Nitrite, UA: NEGATIVE
PH UA: 5.5 (ref 5.0–7.5)
Protein, UA: NEGATIVE
SPEC GRAV UA: 1.025 (ref 1.005–1.030)
UUROB: 0.2 mg/dL (ref 0.2–1.0)

## 2017-03-19 MED ORDER — TRIMETHOPRIM 100 MG PO TABS: 100.0000 mg | ORAL_TABLET | Freq: Every day | ORAL | 11 refills | Status: AC
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
## 2017-03-21 LAB — CULTURE, URINE COMPREHENSIVE

## 2017-05-07 ENCOUNTER — Ambulatory Visit: Payer: BC Managed Care – PPO | Admitting: Urology

## 2017-05-07 VITALS — BP 147/97 | HR 85 | Ht 62.0 in | Wt 133.0 lb

## 2017-05-07 DIAGNOSIS — N3020 Other chronic cystitis without hematuria: Secondary | ICD-10-CM | POA: Diagnosis not present

## 2017-05-07 LAB — MICROSCOPIC EXAMINATION
Epithelial Cells (non renal): >10 /hpf — ABNORMAL HIGH (ref 0–10)
RBC MICROSCOPIC, UA: NONE SEEN /HPF (ref 0–?)

## 2017-05-07 LAB — URINALYSIS, COMPLETE
Bilirubin, UA: NEGATIVE
GLUCOSE, UA: NEGATIVE
Ketones, UA: NEGATIVE
Nitrite, UA: NEGATIVE
PH UA: 6 (ref 5.0–7.5)
PROTEIN UA: NEGATIVE
RBC UA: NEGATIVE
Specific Gravity, UA: 1.025 (ref 1.005–1.030)
UUROB: 0.2 mg/dL (ref 0.2–1.0)

## 2017-05-07 MED ORDER — LIDOCAINE HCL 2 % EX GEL
1.0000 "application " | Freq: Once | CUTANEOUS | Status: AC
  Administered 2017-05-07: 1 via URETHRAL

## 2017-05-07 MED ORDER — CIPROFLOXACIN HCL 500 MG PO TABS
500.0000 mg | ORAL_TABLET | Freq: Once | ORAL | Status: AC
  Administered 2017-05-07: 500 mg via ORAL
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
## 2017-08-06 ENCOUNTER — Ambulatory Visit: Admission: RE | Admit: 2017-08-06 | Payer: BC Managed Care – PPO | Source: Ambulatory Visit

## 2017-08-10 ENCOUNTER — Ambulatory Visit
Admission: RE | Admit: 2017-08-10 | Discharge: 2017-08-10 | Disposition: A | Discharge status: Home / Self Care | Payer: BC Managed Care – PPO | Source: Ambulatory Visit | Attending: Internal Medicine | Admitting: Internal Medicine

## 2017-08-10 DIAGNOSIS — N200 Calculus of kidney: Secondary | ICD-10-CM | POA: Diagnosis not present

## 2017-08-10 DIAGNOSIS — E2609 Other primary hyperaldosteronism: Secondary | ICD-10-CM | POA: Insufficient documentation

## 2017-11-15 ENCOUNTER — Other Ambulatory Visit: Payer: Self-pay | Admitting: Internal Medicine

## 2017-11-15 DIAGNOSIS — E2609 Other primary hyperaldosteronism: Secondary | ICD-10-CM

## 2017-11-15 DIAGNOSIS — I10 Essential (primary) hypertension: Secondary | ICD-10-CM

## 2017-11-21 ENCOUNTER — Ambulatory Visit
Admission: RE | Admit: 2017-11-21 | Discharge: 2017-11-21 | Disposition: A | Discharge status: Home / Self Care | Payer: BC Managed Care – PPO | Source: Ambulatory Visit | Attending: Internal Medicine | Admitting: Internal Medicine

## 2017-11-21 DIAGNOSIS — I10 Essential (primary) hypertension: Secondary | ICD-10-CM | POA: Diagnosis present

## 2017-11-21 DIAGNOSIS — E213 Hyperparathyroidism, unspecified: Secondary | ICD-10-CM | POA: Diagnosis not present

## 2017-11-21 DIAGNOSIS — N281 Cyst of kidney, acquired: Secondary | ICD-10-CM | POA: Insufficient documentation

## 2017-11-21 DIAGNOSIS — E2609 Other primary hyperaldosteronism: Secondary | ICD-10-CM | POA: Diagnosis not present

## 2017-11-21 DIAGNOSIS — N2589 Other disorders resulting from impaired renal tubular function: Secondary | ICD-10-CM | POA: Insufficient documentation

## 2019-07-21 ENCOUNTER — Other Ambulatory Visit: Payer: Self-pay | Admitting: Obstetrics and Gynecology

## 2019-08-08 IMAGING — CT CT ABDOMEN W/O CM
2 of 4 series · 16 of 46 positions shown, 18 images · non-contrast
Comparison: 04/14/2016

CLINICAL DATA: Primary hyperaldosteronism, unexplained
hypertension; aldosterone renin ratio > 20

EXAM:
CT ABDOMEN WITHOUT CONTRAST
TECHNIQUE: Multidetector CT imaging of the abdomen was performed following the
standard protocol without IV contrast. Sagittal and coronal MPR
images reconstructed from axial data set. Patient drank dilute oral
contrast for exam

[Series 2: axial st adrenals · axial · 0.61mm/px · z∈[-1265,-1065]mm · 13 of 112 slices shown, 15 images]
[im 6/112  soft-tissue]
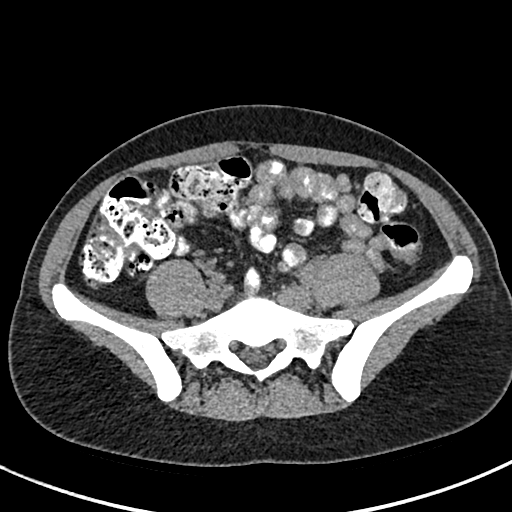
[im 6/112  bone]
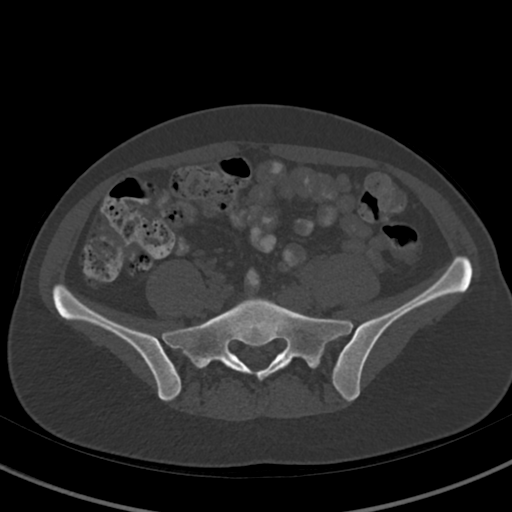
[im 16/112  soft-tissue]
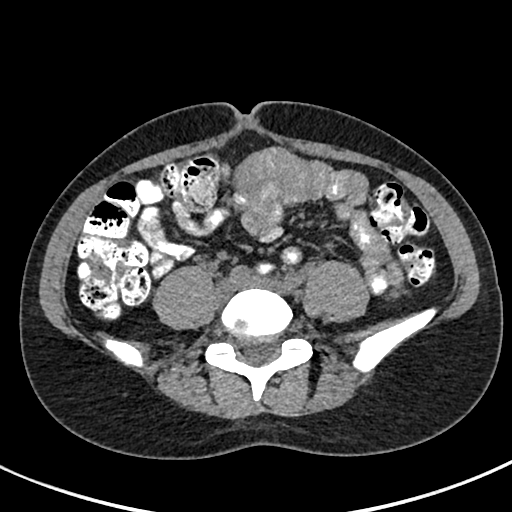
[im 26/112  soft-tissue]
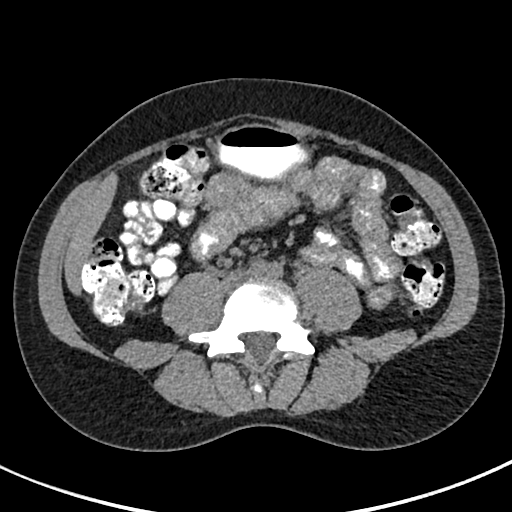
[im 31/112  soft-tissue]
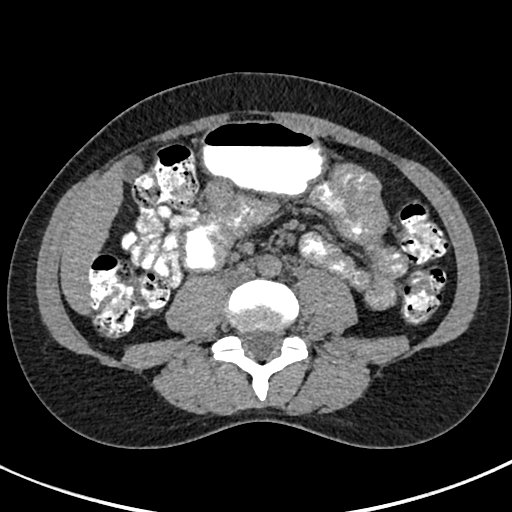
[im 41/112  soft-tissue]
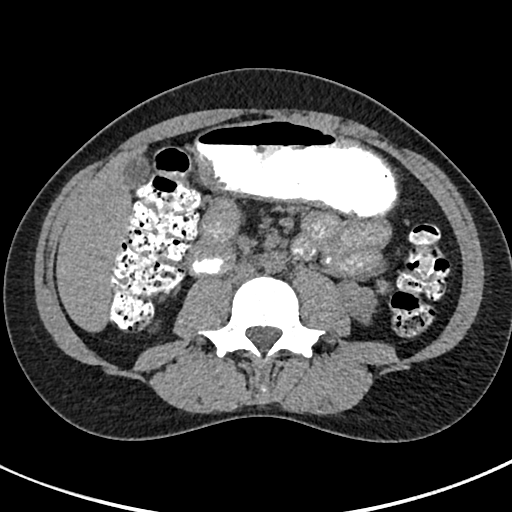
[im 46/112  soft-tissue]
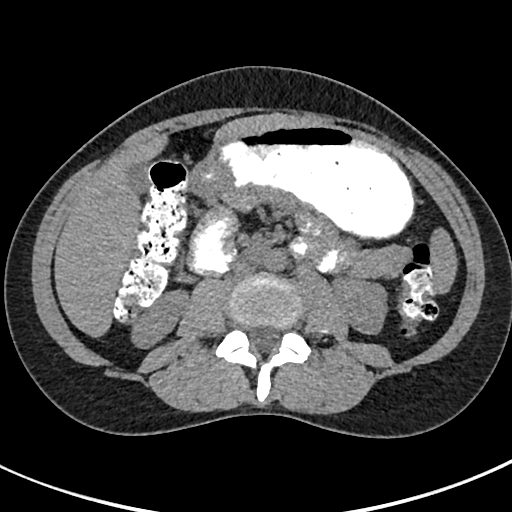
[im 56/112  soft-tissue]
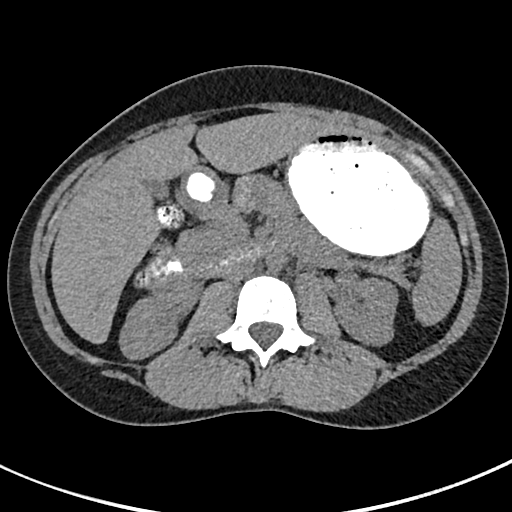
[im 66/112  soft-tissue]
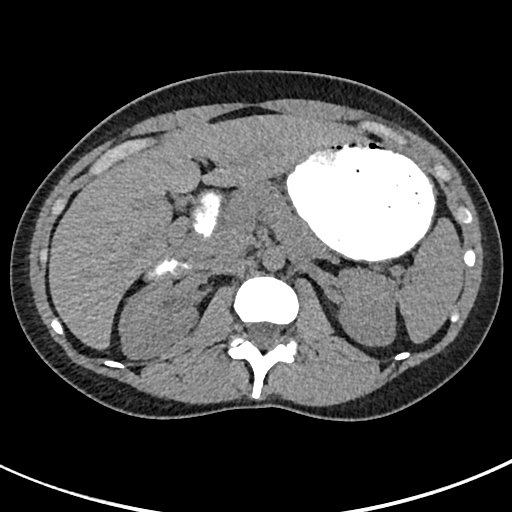
[im 71/112  soft-tissue]
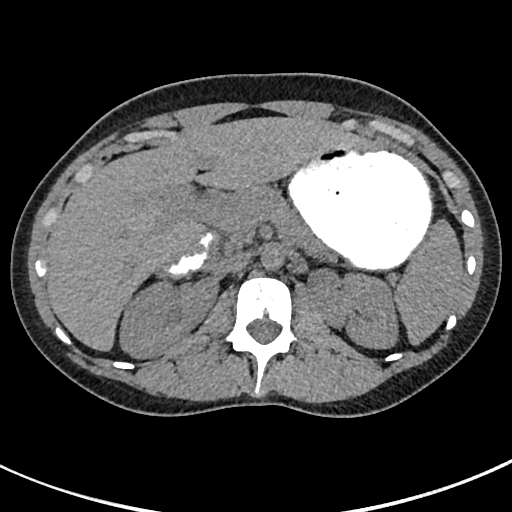
[im 71/112  bone]
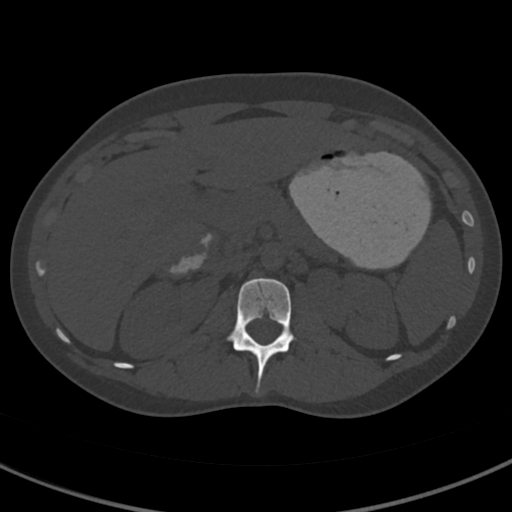
[im 81/112  soft-tissue]
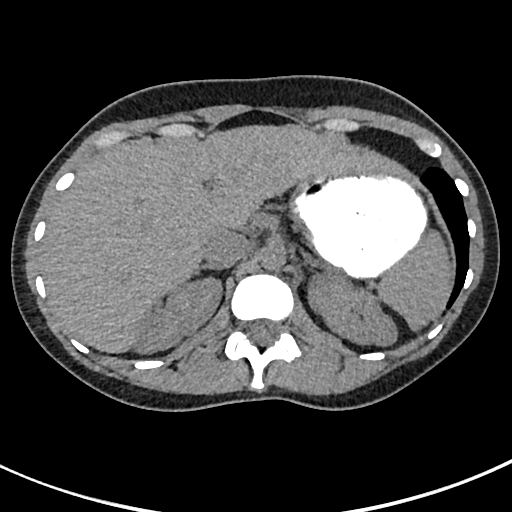
[im 86/112  soft-tissue]
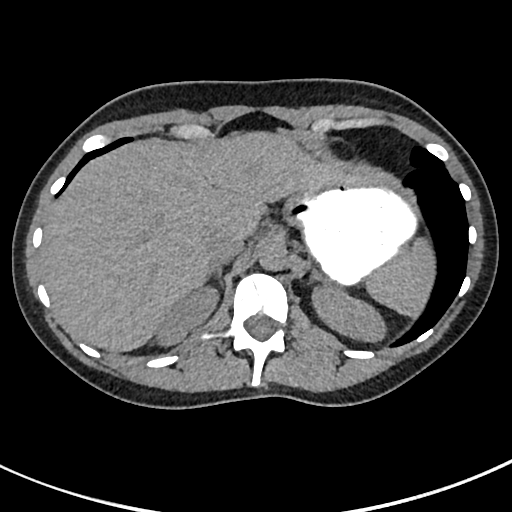
[im 96/112  soft-tissue]
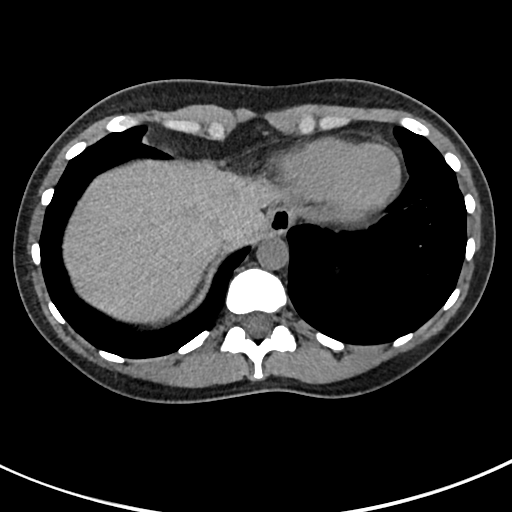
[im 106/112  soft-tissue]
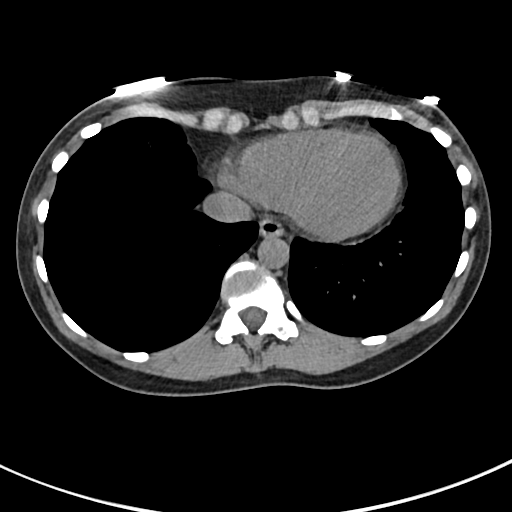

[Series 5: cor st adrenals · coronal · 0.44mm/px · 3 of 131 slices shown]
[im 44/131  soft-tissue]
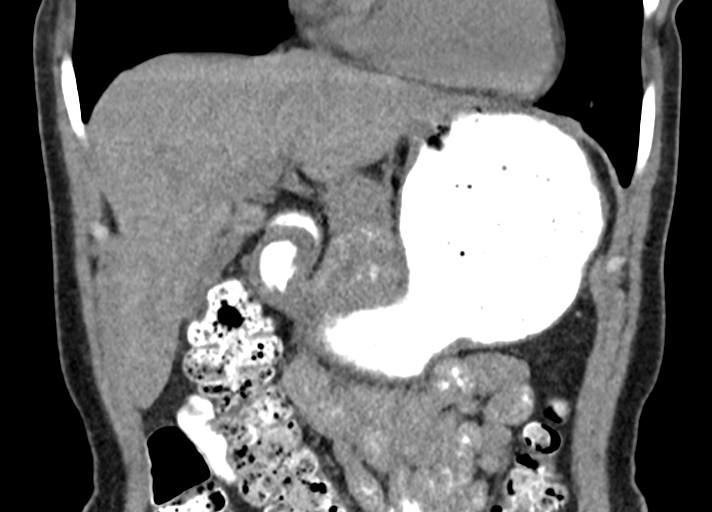
[im 58/131  soft-tissue]
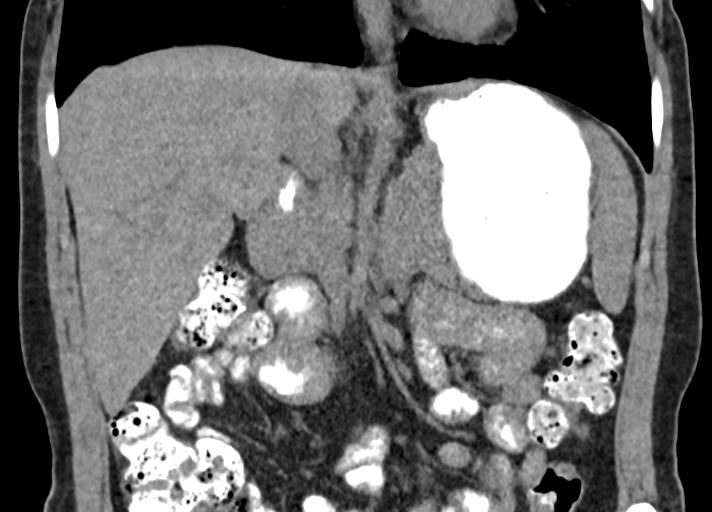
[im 73/131  soft-tissue]
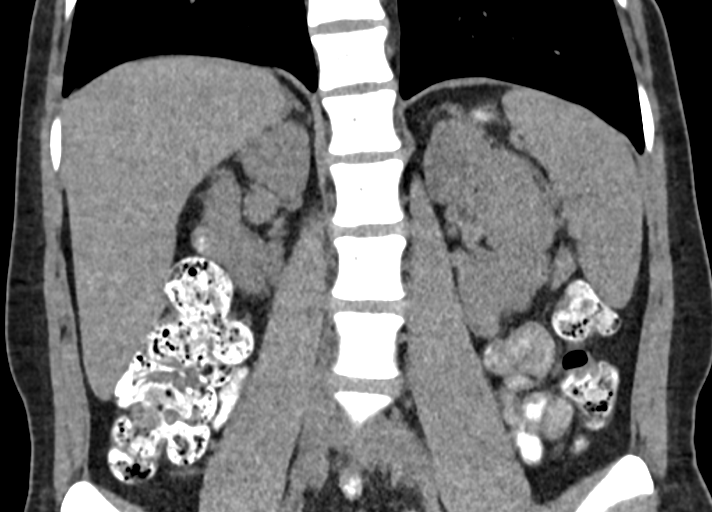

[16 of 46 positions shown; findings below may reference images not displayed]

FINDINGS: Lower chest: Lung bases clear

Hepatobiliary: Contracted gallbladder.  Liver unremarkable.

Pancreas: Normal appearance

Spleen: Normal appearance. Probable small splenule posterior to the
spleen

Adrenals/Urinary Tract: Adrenal glands normal size and appearance.
No discrete adrenal mass or nodule identified. Tiny nonobstructing
calculi at inferior pole of RIGHT kidney. Proximal ureters
unremarkable. No hydronephrosis or renal mass.

Stomach/Bowel: Visualized large and small bowel loops unremarkable.
Bowel wall thickening of distal gastric antrum and pylorus which
could be pathologic or secondary to an artifact from
underdistention.

Vascular/Lymphatic: Vascular structures unremarkable. No adenopathy.

Other: No free air or free fluid. No inflammatory process. No
hernia.

Musculoskeletal: Osseous structures unremarkable.
IMPRESSION: No evidence of adrenal mass.

Tiny nonobstructing renal calculi.

Bowel wall thickening of the distal gastric antrum and pylorus
versus artifact from underdistention; recommend correlation with
upper GI exam or upper endoscopy to exclude mass and ulcer.

## 2019-11-04 ENCOUNTER — Other Ambulatory Visit: Payer: Self-pay | Admitting: Internal Medicine

## 2019-11-04 DIAGNOSIS — E2609 Other primary hyperaldosteronism: Secondary | ICD-10-CM

## 2020-03-19 ENCOUNTER — Emergency Department: Payer: BC Managed Care – PPO

## 2020-03-19 ENCOUNTER — Other Ambulatory Visit: Payer: Self-pay

## 2020-03-19 DIAGNOSIS — R202 Paresthesia of skin: Secondary | ICD-10-CM | POA: Diagnosis not present

## 2020-03-19 DIAGNOSIS — I10 Essential (primary) hypertension: Secondary | ICD-10-CM | POA: Diagnosis not present

## 2020-03-19 DIAGNOSIS — R079 Chest pain, unspecified: Secondary | ICD-10-CM | POA: Diagnosis present

## 2020-03-19 DIAGNOSIS — M542 Cervicalgia: Secondary | ICD-10-CM | POA: Insufficient documentation

## 2020-03-19 DIAGNOSIS — Z79899 Other long term (current) drug therapy: Secondary | ICD-10-CM | POA: Insufficient documentation

## 2020-03-19 DIAGNOSIS — M79602 Pain in left arm: Secondary | ICD-10-CM | POA: Diagnosis not present

## 2020-03-19 LAB — BASIC METABOLIC PANEL
Anion gap: 14 (ref 5–15)
BUN: 18 mg/dL (ref 6–20)
CO2: 23 mmol/L (ref 22–32)
Calcium: 9.7 mg/dL (ref 8.9–10.3)
Chloride: 103 mmol/L (ref 98–111)
Creatinine, Ser: 1.02 mg/dL — ABNORMAL HIGH (ref 0.44–1.00)
GFR, Estimated: >60 mL/min (ref >60)
Glucose, Bld: 107 mg/dL — ABNORMAL HIGH (ref 70–99)
Potassium: 3.9 mmol/L (ref 3.5–5.1)
Sodium: 140 mmol/L (ref 135–145)

## 2020-03-19 LAB — CBC
HCT: 44.5 % (ref 36.0–46.0)
Hemoglobin: 14.2 g/dL (ref 12.0–15.0)
MCH: 30 pg (ref 26.0–34.0)
MCHC: 31.9 g/dL (ref 30.0–36.0)
MCV: 94.1 fL (ref 80.0–100.0)
Platelets: 327 10*3/uL (ref 150–400)
RBC: 4.73 MIL/uL (ref 3.87–5.11)
RDW: 12.9 % (ref 11.5–15.5)
WBC: 10 10*3/uL (ref 4.0–10.5)
nRBC: 0 % (ref 0.0–0.2)

## 2020-03-19 LAB — TROPONIN I (HIGH SENSITIVITY): Troponin I (High Sensitivity): 4 ng/L (ref <18)

## 2020-03-19 NOTE — ED Triage Notes (Addendum)
Pt endorsing left sided chest pain radiating to left arm and left neck starting today with hypertension. Is on amlodipine for hypertension. Endorses BP difference in arms, one arm feeling cooler than the other. BP 153/100 on left arm and 161/108 in right arm.

## 2020-03-20 ENCOUNTER — Emergency Department
Admission: EM | Admit: 2020-03-20 | Discharge: 2020-03-20 | Disposition: A | Discharge status: Home / Self Care | Payer: BC Managed Care – PPO | Attending: Emergency Medicine | Admitting: Emergency Medicine

## 2020-03-20 DIAGNOSIS — I10 Essential (primary) hypertension: Secondary | ICD-10-CM

## 2020-03-20 DIAGNOSIS — R079 Chest pain, unspecified: Secondary | ICD-10-CM

## 2020-03-20 LAB — TROPONIN I (HIGH SENSITIVITY): Troponin I (High Sensitivity): 4 ng/L (ref <18)
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
## 2020-11-12 ENCOUNTER — Other Ambulatory Visit (HOSPITAL_COMMUNITY): Payer: Self-pay | Admitting: Internal Medicine

## 2020-11-12 ENCOUNTER — Other Ambulatory Visit: Payer: Self-pay | Admitting: Internal Medicine

## 2020-11-12 DIAGNOSIS — E2609 Other primary hyperaldosteronism: Secondary | ICD-10-CM

## 2021-05-05 ENCOUNTER — Encounter: Payer: Self-pay | Admitting: *Deleted

## 2021-05-05 ENCOUNTER — Other Ambulatory Visit: Payer: Self-pay

## 2021-05-05 DIAGNOSIS — Z8616 Personal history of COVID-19: Secondary | ICD-10-CM | POA: Diagnosis not present

## 2021-05-05 DIAGNOSIS — N200 Calculus of kidney: Secondary | ICD-10-CM | POA: Diagnosis not present

## 2021-05-05 DIAGNOSIS — Z20822 Contact with and (suspected) exposure to covid-19: Secondary | ICD-10-CM | POA: Diagnosis not present

## 2021-05-05 DIAGNOSIS — R319 Hematuria, unspecified: Secondary | ICD-10-CM | POA: Insufficient documentation

## 2021-05-05 DIAGNOSIS — I10 Essential (primary) hypertension: Secondary | ICD-10-CM | POA: Diagnosis not present

## 2021-05-05 DIAGNOSIS — R0602 Shortness of breath: Secondary | ICD-10-CM | POA: Insufficient documentation

## 2021-05-05 DIAGNOSIS — R000 Tachycardia, unspecified: Secondary | ICD-10-CM | POA: Diagnosis not present

## 2021-05-05 DIAGNOSIS — R0781 Pleurodynia: Secondary | ICD-10-CM | POA: Diagnosis not present

## 2021-05-05 DIAGNOSIS — M546 Pain in thoracic spine: Secondary | ICD-10-CM | POA: Diagnosis present

## 2021-05-05 LAB — CBC
HCT: 40.1 % (ref 36.0–46.0)
Hemoglobin: 13.2 g/dL (ref 12.0–15.0)
MCH: 30.3 pg (ref 26.0–34.0)
MCHC: 32.9 g/dL (ref 30.0–36.0)
MCV: 92.2 fL (ref 80.0–100.0)
Platelets: 264 10*3/uL (ref 150–400)
RBC: 4.35 MIL/uL (ref 3.87–5.11)
RDW: 13.2 % (ref 11.5–15.5)
WBC: 8.7 10*3/uL (ref 4.0–10.5)
nRBC: 0 % (ref 0.0–0.2)

## 2021-05-05 LAB — BASIC METABOLIC PANEL
Anion gap: 6 (ref 5–15)
BUN: 18 mg/dL (ref 6–20)
CO2: 25 mmol/L (ref 22–32)
Calcium: 8.4 mg/dL — ABNORMAL LOW (ref 8.9–10.3)
Chloride: 107 mmol/L (ref 98–111)
Creatinine, Ser: 1.02 mg/dL — ABNORMAL HIGH (ref 0.44–1.00)
GFR, Estimated: >60 mL/min (ref >60)
Glucose, Bld: 114 mg/dL — ABNORMAL HIGH (ref 70–99)
Potassium: 3.9 mmol/L (ref 3.5–5.1)
Sodium: 138 mmol/L (ref 135–145)

## 2021-05-05 LAB — URINALYSIS, ROUTINE W REFLEX MICROSCOPIC
Bacteria, UA: NONE SEEN
Bilirubin Urine: NEGATIVE
Glucose, UA: NEGATIVE mg/dL
Ketones, ur: 5 mg/dL — AB
Nitrite: NEGATIVE
Protein, ur: 30 mg/dL — AB
Specific Gravity, Urine: 1.023 (ref 1.005–1.030)
WBC, UA: >50 WBC/hpf — ABNORMAL HIGH (ref 0–5)
pH: 6 (ref 5.0–8.0)

## 2021-05-05 LAB — POC URINE PREG, ED: Preg Test, Ur: NEGATIVE

## 2021-05-05 NOTE — ED Notes (Signed)
Pt lying on floor in lobby hyperventilating; pt assisted into w/c by husband; taken to triage for reassessment; c/o lower back pain and UTI symptoms x wk; pt assisted into recliner for comfort and updated on wait time

## 2021-05-05 NOTE — ED Triage Notes (Signed)
Pt reports she has a uti and blood in urine.  Treated with abx since yesterday.  Pt feels worse today.   Pt alert  speech clear.

## 2021-05-06 ENCOUNTER — Emergency Department
Admission: EM | Admit: 2021-05-06 | Discharge: 2021-05-06 | Disposition: A | Discharge status: Home / Self Care | Payer: 59 | Attending: Emergency Medicine | Admitting: Emergency Medicine

## 2021-05-06 ENCOUNTER — Emergency Department: Payer: 59

## 2021-05-06 ENCOUNTER — Other Ambulatory Visit: Payer: Self-pay

## 2021-05-06 DIAGNOSIS — N12 Tubulo-interstitial nephritis, not specified as acute or chronic: Secondary | ICD-10-CM

## 2021-05-06 LAB — RESP PANEL BY RT-PCR (FLU A&B, COVID) ARPGX2
Influenza A by PCR: NEGATIVE
Influenza B by PCR: NEGATIVE
SARS Coronavirus 2 by RT PCR: NEGATIVE

## 2021-05-06 LAB — HEPATIC FUNCTION PANEL
ALT: 20 U/L (ref 0–44)
AST: 18 U/L (ref 15–41)
Albumin: 4 g/dL (ref 3.5–5.0)
Alkaline Phosphatase: 52 U/L (ref 38–126)
Bilirubin, Direct: <0.1 mg/dL (ref 0.0–0.2)
Total Bilirubin: 0.3 mg/dL (ref 0.3–1.2)
Total Protein: 6.9 g/dL (ref 6.5–8.1)

## 2021-05-06 LAB — TROPONIN I (HIGH SENSITIVITY)
Troponin I (High Sensitivity): 3 ng/L (ref <18)
Troponin I (High Sensitivity): 3 ng/L (ref <18)

## 2021-05-06 LAB — LIPASE, BLOOD: Lipase: 37 U/L (ref 11–51)

## 2021-05-06 LAB — D-DIMER, QUANTITATIVE: D-Dimer, Quant: 0.3 ug/mL-FEU (ref 0.00–0.50)

## 2021-05-06 MED ORDER — CIPROFLOXACIN HCL 500 MG PO TABS: 500.0000 mg | ORAL_TABLET | Freq: Two times a day (BID) | ORAL | 0 refills | Status: AC

## 2021-05-06 MED ORDER — SODIUM CHLORIDE 0.9 % IV BOLUS
1000.0000 mL | Freq: Once | INTRAVENOUS | Status: AC
  Administered 2021-05-06: 1000 mL via INTRAVENOUS

## 2021-05-06 MED ORDER — LIDOCAINE 5 % EX PTCH
1.0000 | MEDICATED_PATCH | CUTANEOUS | Status: DC
  Administered 2021-05-06: 1 via TRANSDERMAL
  Filled 2021-05-06: qty 1

## 2021-05-06 MED ORDER — KETOROLAC TROMETHAMINE 30 MG/ML IJ SOLN
15.0000 mg | Freq: Once | INTRAMUSCULAR | Status: AC
Start: 2021-05-06 — End: 2021-05-06
  Administered 2021-05-06: 15 mg via INTRAVENOUS
  Filled 2021-05-06: qty 1

## 2021-05-06 MED ORDER — LIDOCAINE 5 % EX PTCH: 1.0000 | MEDICATED_PATCH | Freq: Two times a day (BID) | CUTANEOUS | 0 refills | Status: AC

## 2021-05-06 MED ORDER — IBUPROFEN 600 MG PO TABS
600.0000 mg | ORAL_TABLET | Freq: Four times a day (QID) | ORAL | 0 refills | Status: AC | PRN
Start: 2021-05-06 — End: 2021-05-13

## 2021-05-06 MED ORDER — ACETAMINOPHEN 500 MG PO TABS
1000.0000 mg | ORAL_TABLET | Freq: Once | ORAL | Status: AC
  Administered 2021-05-06: 1000 mg via ORAL
  Filled 2021-05-06: qty 2

## 2021-05-06 NOTE — Discharge Instructions (Addendum)
We are covering you with an action 2 days of antibiotics due to concern this could have traveled to your kidneys and starting you on some ibuprofen and lidocaine patches to help with pain.  He can also take Tylenol 1 g every 8 hours.  Return to the ER if develop worsening fevers, or other concerns  IMPRESSION: 1. 2 mm nonobstructing right renal calculus. 2. 2.2 cm diameter partially calcified left renal cyst.

## 2021-05-06 NOTE — ED Provider Notes (Signed)
[facility] Provider Note    Event Date/Time   First MD Initiated Contact with Patient 05/06/21 [phone]     (approximate)   History   Back Pain and Hematuria   HPI  Candice Gonzalez is a 36 y.o. female with hypertension, hyperaldosterone who comes in with concerns for flank pain.  I reviewed a note from patient's primary care doctor on 04/24/2021 where patient had complained of some fatigue and increased frequency of urination.  Patient was started on some antibiotics for UTI.  I did review labs from this visit that were reassuring and her urine was concerning for UTI.  She reports taking the antibiotics since yesterday.  She reports feeling worse today.  Patient is on ciprofloxacin 500 twice daily for 5 days.  She reports increasing pain into her back going into her upper back with some pain with taking a deep breath.  She does report some intermittent shortness of breath since she had COVID back in 2021 but more recently she was starting to feel more short of breath.  Denies any falls, hitting her head or any abdominal pain.  Patient reports that she was told to come into the emergency room if she developed elevated blood pressure because that can be a sign of things worsening.   Physical Exam   Triage Vital Signs: ED Triage Vitals  Enc Vitals Group     BP 05/05/21 2126 (!) 143/108     Pulse Rate 05/05/21 2126 (!) 102     Resp 05/05/21 2126 18     Temp 05/05/21 2126 98.5 F (36.9 C)     Temp Source 05/05/21 2126 Oral     SpO2 05/05/21 2126 100 %     Weight 05/05/21 2203 152 lb (68.9 kg)     Height 05/05/21 2203 5\' 2"  (1.575 m)     Head Circumference --      Peak Flow --      Pain Score 05/05/21 2203 6     Pain Loc --      Pain Edu? --      Excl. in [place]? --     Most recent vital signs: Vitals:   05/05/21 2126 05/05/21 2353  BP: (!) 143/108 (!) 156/117  Pulse: (!) 102 (!) 105  Resp: 18 (!) 22  Temp: 98.5 F (36.9 C) (!) 97.5 F (36.4 C)   SpO2: 100% 100%     General: Awake, no distress.  CV:  Good peripheral perfusion.  Slightly tachycardic Resp:  Normal effort.  Clear lung Abd:  No distention.  Soft and nontender Other:  No swelling noted to the legs. Patient reporting some upper back pain.  No rash noted   ED Results / Procedures / Treatments   Labs (all labs ordered are listed, but only abnormal results are displayed) Labs Reviewed  BASIC METABOLIC PANEL - Abnormal; Notable for the following components:      Result Value   Glucose, Bld 114 (*)    Creatinine, Ser 1.02 (*)    Calcium 8.4 (*)    All other components within normal limits  URINALYSIS, ROUTINE W REFLEX MICROSCOPIC - Abnormal; Notable for the following components:   Color, Urine YELLOW (*)    APPearance CLOUDY (*)    Hgb urine dipstick MODERATE (*)    Ketones, ur 5 (*)    Protein, ur 30 (*)    Leukocytes,Ua LARGE (*)    WBC, UA >50 (*)    All other components  within normal limits  CBC  HEPATIC FUNCTION PANEL  LIPASE, BLOOD  POC URINE PREG, ED     EKG  My interpretation of EKG: Normal sinus rate of 73 without any ST elevation or T wave inversions, normal intervals   RADIOLOGY I have reviewed the CT imaging and I am pending the radiology read   PROCEDURES:  Critical Care performed: No  .1-3 Lead EKG Interpretation Performed by: Concha Se, MD Authorized by: Concha Se, MD     Interpretation: abnormal     ECG rate:  100s   ECG rate assessment: tachycardic     Rhythm: sinus tachycardia     Ectopy: none     Conduction: normal     MEDICATIONS ORDERED IN ED: Medications  lidocaine (LIDODERM) 5 % 1 patch (1 patch Transdermal Patch Applied 05/06/21 0335)  sodium chloride 0.9 % bolus 1,000 mL (1,000 mLs Intravenous New Bag/Given 05/06/21 0335)  ketorolac (TORADOL) 30 MG/ML injection 15 mg (15 mg Intravenous Given 05/06/21 0335)  acetaminophen (TYLENOL) tablet 1,000 mg (1,000 mg Oral Given 05/06/21 0335)     IMPRESSION /  MDM / ASSESSMENT AND PLAN / ED COURSE  I reviewed the triage vital signs and the nursing notes.   Differential diagnosis includes, but is not limited to, UTI, pyelonephritis, kidney stone.  Patient also tachycardic with some pleuritic chest pain and some shortness of breath.  Will get COVID, flu.  Already had chest x-ray that was reassuring but I am unable to review it here.  We will get D-dimer given patient is tachycardic to rule out PE.  Patient slightly hypertensive but I suspect that this also could be secondary to patient being uncomfortable.  BMP shows stable kidney function CBC shows no evidence of white count elevation.  No evidence of anemia. Urine shows continued UTI but patient just started treatment  Patient given IV fluids, IV Toradol, Tylenol, lidocaine patch to help with pain  Radiology read of CT shows 2 mm nonobstructing right renal calculus and 2.2 cm dilated partial calcified left renal cyst.  Discussed these incidental findings with patient  Troponins are negative x2  D-dimer is negative  I considered admission given patient's history and my concern that patient could have pyelonephritis but patient is afebrile, well-appearing tolerating p.o.  She reports feeling much better.  We will give her a total of 7 days of ciprofloxacin given the concern for possible pyelonephritis and she was initially only prescribed 5 days, lidocaine patches, ibuprofen to help with pain.  She expressed understanding felt comfortable with discharge home  The patient is on the cardiac monitor to evaluate for evidence of arrhythmia and/or significant heart rate changes.    FINAL CLINICAL IMPRESSION(S) / ED DIAGNOSES   Final diagnoses:  Pyelonephritis     Rx / DC Orders   ED Discharge Orders          Ordered    ciprofloxacin (CIPRO) 500 MG tablet  2 times daily        05/06/21 0553    lidocaine (LIDODERM) 5 %  Every 12 hours        05/06/21 0553    ibuprofen (ADVIL) 600 MG tablet   Every 6 hours PRN        05/06/21 0553             Note:  This document was prepared using Dragon voice recognition software and may include unintentional dictation errors.   Concha Se, MD 05/06/21 [phone]

## 2021-05-07 LAB — URINE CULTURE: Culture: NO GROWTH

## 2021-05-10 ENCOUNTER — Other Ambulatory Visit: Payer: Self-pay | Admitting: Internal Medicine

## 2021-05-10 DIAGNOSIS — E278 Other specified disorders of adrenal gland: Secondary | ICD-10-CM

## 2023-05-04 IMAGING — CT CT RENAL STONE PROTOCOL
2 of 4 series · 16 of 46 positions shown, 18 images · non-contrast
Comparison: August 10, 2017

CLINICAL DATA: History of current UTI and hematuria.



[Series 2: stone full standard · axial · 0.84mm/px · z∈[-544,-154]mm · 13 of 86 slices shown, 15 images]
[im 4/86  soft-tissue]
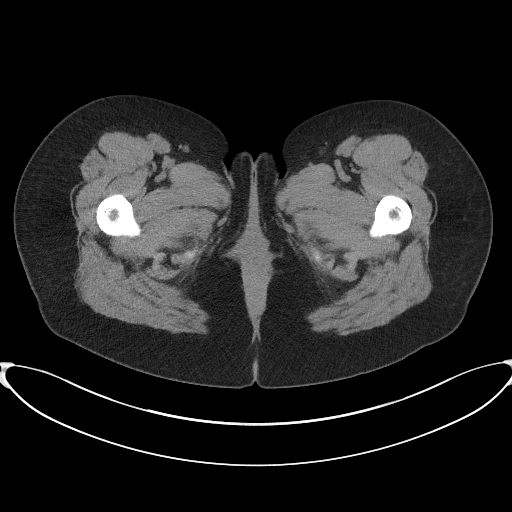
[im 4/86  bone]
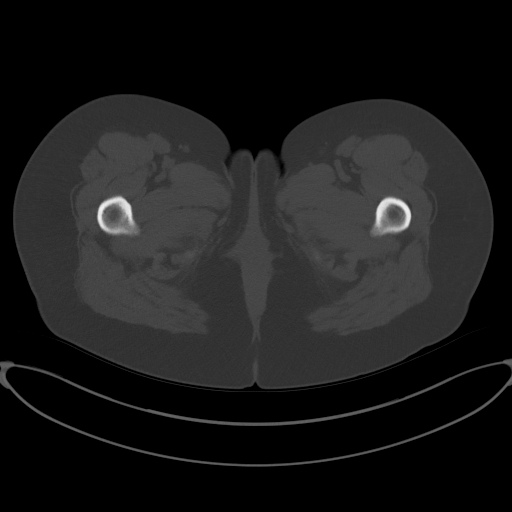
[im 11/86  soft-tissue]
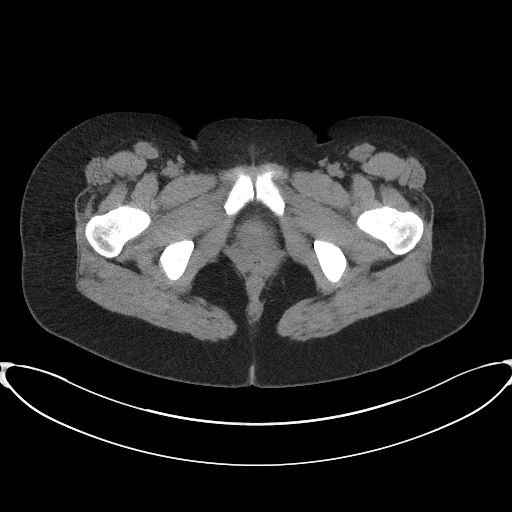
[im 18/86  soft-tissue]
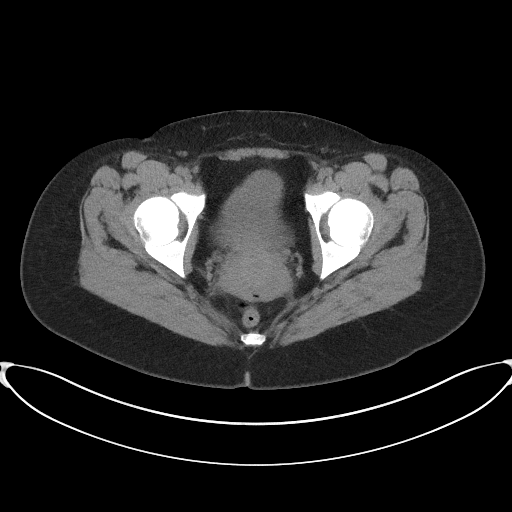
[im 25/86  soft-tissue]
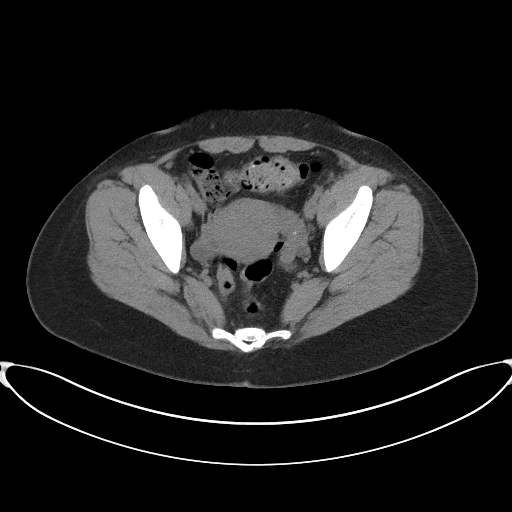
[im 29/86  soft-tissue]
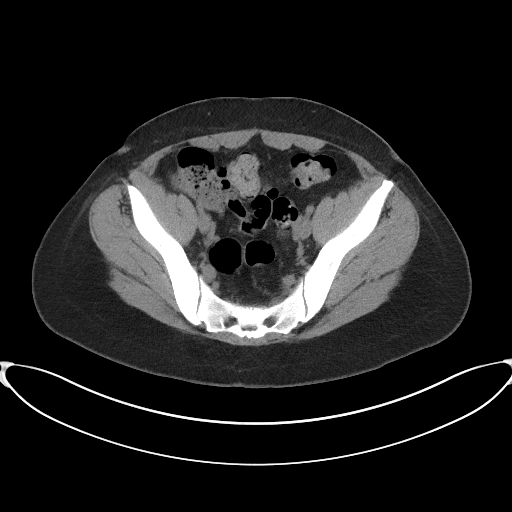
[im 36/86  soft-tissue]
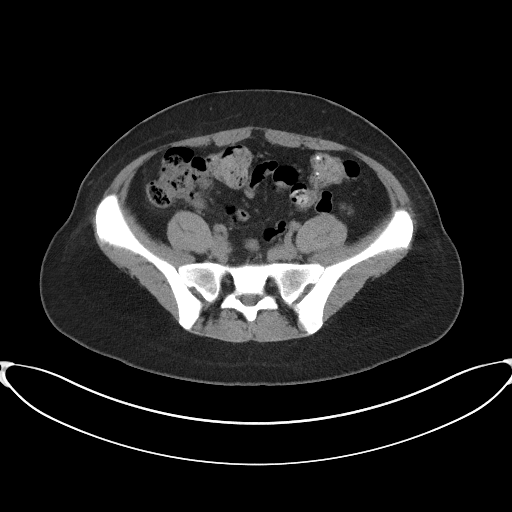
[im 43/86  soft-tissue]
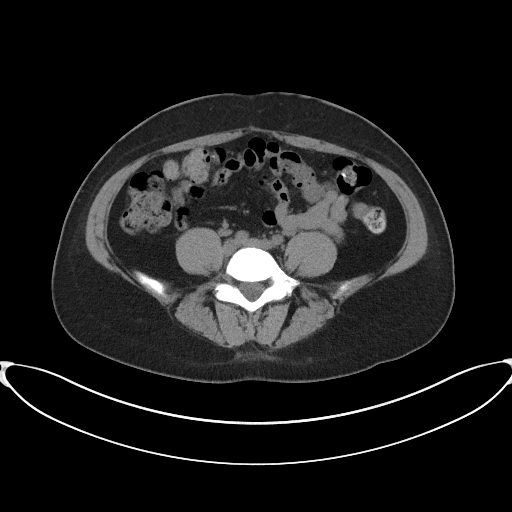
[im 50/86  soft-tissue]
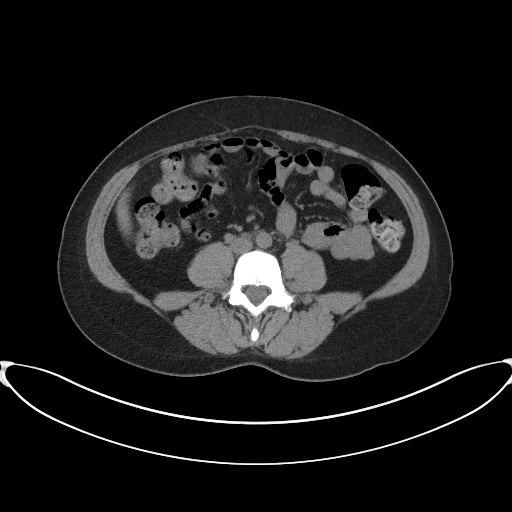
[im 57/86  soft-tissue]
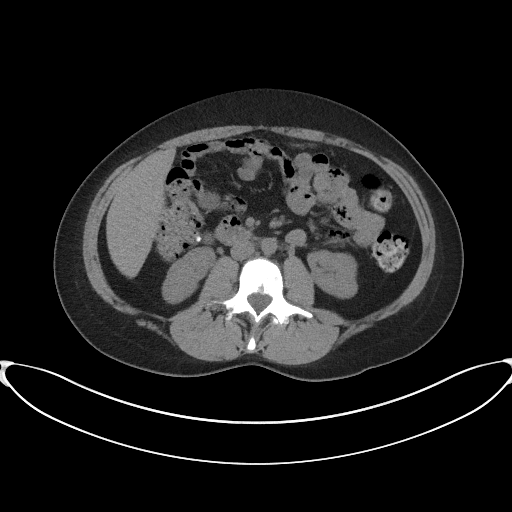
[im 57/86  bone]
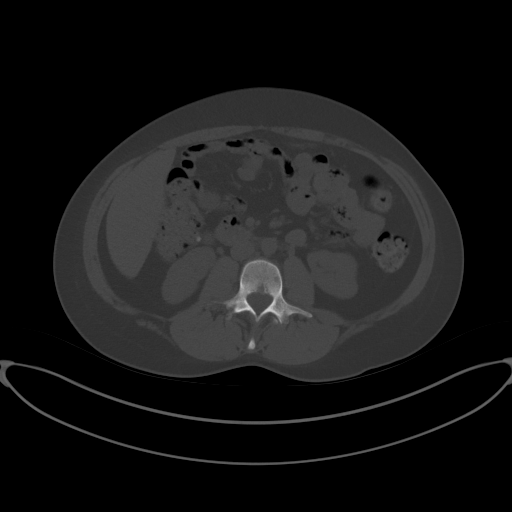
[im 61/86  soft-tissue]
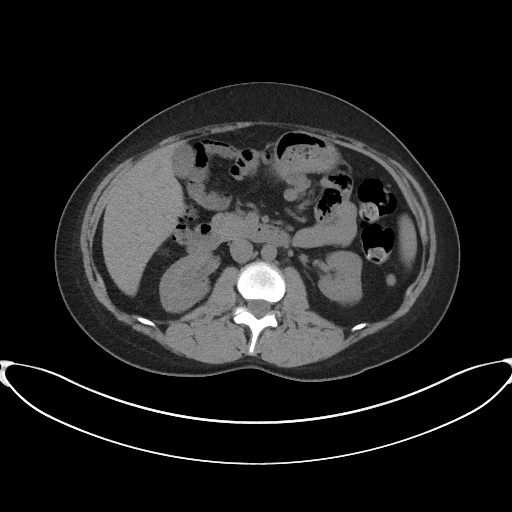
[im 68/86  soft-tissue]
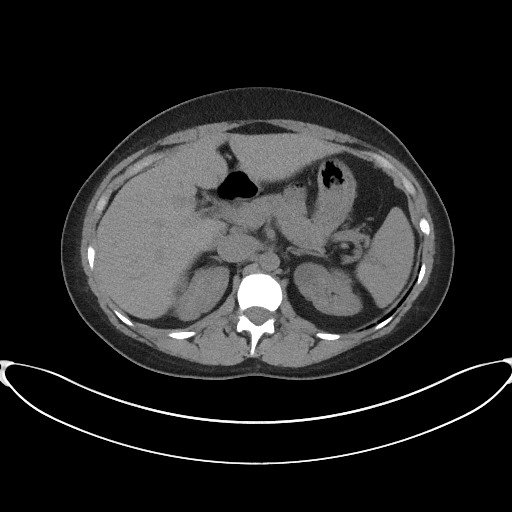
[im 75/86  soft-tissue]
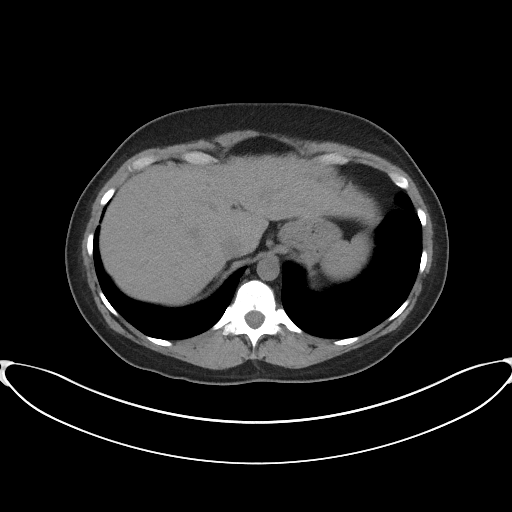
[im 82/86  soft-tissue]
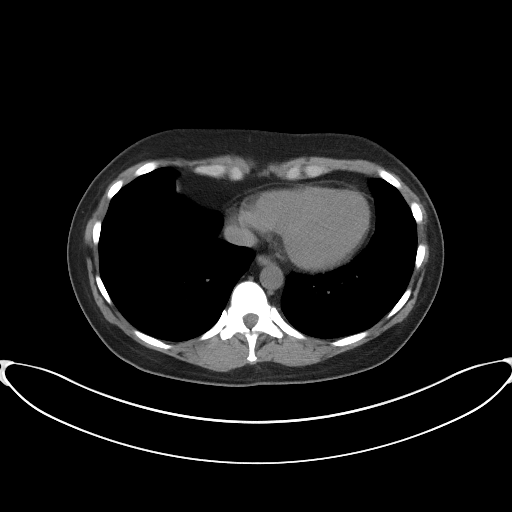

[Series 5: coronal · coronal · 0.80mm/px · 3 of 131 slices shown]
[im 44/131  soft-tissue]
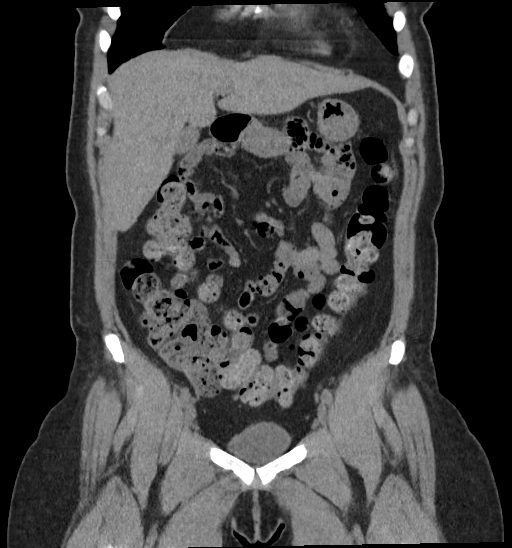
[im 58/131  soft-tissue]
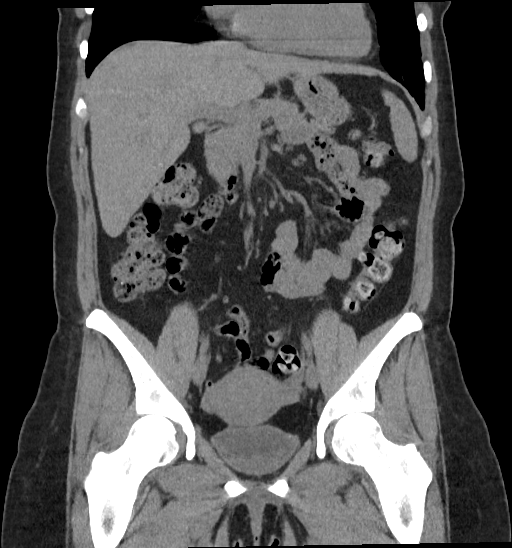
[im 73/131  soft-tissue]
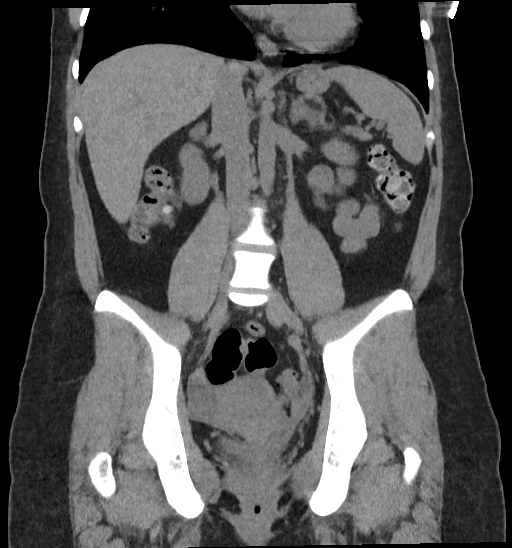

[16 of 46 positions shown; findings below may reference images not displayed]

FINDINGS: Lower chest: No acute abnormality.

Hepatobiliary: No focal liver abnormality is seen. No gallstones,
gallbladder wall thickening, or biliary dilatation.

Pancreas: Unremarkable. No pancreatic ductal dilatation or
surrounding inflammatory changes.

Spleen: Normal in size without focal abnormality.

Adrenals/Urinary Tract: Adrenal glands are unremarkable. Kidneys are
normal in size. A 2.2 cm diameter partially calcified cyst is seen
within the anteromedial aspect of the mid left kidney. A 2 mm
nonobstructing renal calculus is noted within the posterior aspect
of the mid to lower right kidney. Bladder is unremarkable.

Stomach/Bowel: Stomach is within normal limits. Appendix appears
normal. No evidence of bowel wall thickening, distention, or
inflammatory changes.

Vascular/Lymphatic: No significant vascular findings are present. No
enlarged abdominal or pelvic lymph nodes.

Reproductive: Uterus and bilateral adnexa are unremarkable.

Other: No abdominal wall hernia or abnormality. No abdominopelvic
ascites.

Musculoskeletal: No acute or significant osseous findings.
IMPRESSION: 1. 2 mm nonobstructing right renal calculus.
2. 2.2 cm diameter partially calcified left renal cyst.

## 2023-07-01 ENCOUNTER — Other Ambulatory Visit: Payer: Self-pay

## 2023-07-01 ENCOUNTER — Emergency Department
Admission: EM | Admit: 2023-07-01 | Discharge: 2023-07-01 | Disposition: A | Discharge status: Home / Self Care | Attending: Emergency Medicine | Admitting: Emergency Medicine

## 2023-07-01 DIAGNOSIS — N390 Urinary tract infection, site not specified: Secondary | ICD-10-CM | POA: Diagnosis not present

## 2023-07-01 DIAGNOSIS — R350 Frequency of micturition: Secondary | ICD-10-CM | POA: Diagnosis present

## 2023-07-01 LAB — BASIC METABOLIC PANEL
Anion gap: 8 (ref 5–15)
BUN: 19 mg/dL (ref 6–20)
CO2: 28 mmol/L (ref 22–32)
Calcium: 9.9 mg/dL (ref 8.9–10.3)
Chloride: 103 mmol/L (ref 98–111)
Creatinine, Ser: 0.99 mg/dL (ref 0.44–1.00)
GFR, Estimated: >60 mL/min (ref >60)
Glucose, Bld: 93 mg/dL (ref 70–99)
Potassium: 4.2 mmol/L (ref 3.5–5.1)
Sodium: 139 mmol/L (ref 135–145)

## 2023-07-01 LAB — URINALYSIS, ROUTINE W REFLEX MICROSCOPIC
Bacteria, UA: NONE SEEN
Bilirubin Urine: NEGATIVE
Glucose, UA: NEGATIVE mg/dL
Hgb urine dipstick: NEGATIVE
Ketones, ur: NEGATIVE mg/dL
Nitrite: NEGATIVE
Protein, ur: NEGATIVE mg/dL
Specific Gravity, Urine: 1.023 (ref 1.005–1.030)
pH: 5 (ref 5.0–8.0)

## 2023-07-01 LAB — CBC WITH DIFFERENTIAL/PLATELET
Abs Immature Granulocytes: 0.03 10*3/uL (ref 0.00–0.07)
Basophils Absolute: 0.1 10*3/uL (ref 0.0–0.1)
Basophils Relative: 1 %
Eosinophils Absolute: 0.1 10*3/uL (ref 0.0–0.5)
Eosinophils Relative: 1 %
HCT: 41 % (ref 36.0–46.0)
Hemoglobin: 13.4 g/dL (ref 12.0–15.0)
Immature Granulocytes: 0 %
Lymphocytes Relative: 31 %
Lymphs Abs: 2.4 10*3/uL (ref 0.7–4.0)
MCH: 29.5 pg (ref 26.0–34.0)
MCHC: 32.7 g/dL (ref 30.0–36.0)
MCV: 90.1 fL (ref 80.0–100.0)
Monocytes Absolute: 0.6 10*3/uL (ref 0.1–1.0)
Monocytes Relative: 8 %
Neutro Abs: 4.7 10*3/uL (ref 1.7–7.7)
Neutrophils Relative %: 59 %
Platelets: 336 10*3/uL (ref 150–400)
RBC: 4.55 MIL/uL (ref 3.87–5.11)
RDW: 13.1 % (ref 11.5–15.5)
WBC: 7.9 10*3/uL (ref 4.0–10.5)
nRBC: 0 % (ref 0.0–0.2)

## 2023-07-01 LAB — POC URINE PREG, ED: Preg Test, Ur: NEGATIVE

## 2023-07-01 MED ORDER — CIPROFLOXACIN HCL 500 MG PO TABS: 500.0000 mg | ORAL_TABLET | Freq: Two times a day (BID) | ORAL | 0 refills | Status: AC

## 2023-07-01 MED ORDER — CIPROFLOXACIN HCL 500 MG PO TABS
500.0000 mg | ORAL_TABLET | Freq: Once | ORAL | Status: AC
Start: 2023-07-01 — End: 2023-07-01
  Administered 2023-07-01: 500 mg via ORAL
  Filled 2023-07-01: qty 1

## 2023-07-01 MED ORDER — CIPROFLOXACIN HCL 500 MG PO TABS
500.0000 mg | ORAL_TABLET | Freq: Two times a day (BID) | ORAL | 0 refills | Status: DC
Start: 2023-07-01 — End: 2023-07-01

## 2023-07-01 NOTE — ED Provider Notes (Signed)
 [facility] Provider Note    Event Date/Time   First MD Initiated Contact with Patient 07/01/23 1736     (approximate)   History   Urinary Tract Infection   HPI  Candice Gonzalez is a 38 y.o. female who presents with concerns of urinary tract infection, describes urinary frequency.  Recently finished course of Keflex however still continues to feel frequency.  No fevers or chills nausea or vomiting.  No abdominal pain.  Reviewed medical records, urine culture grew staph epidermis which I suspect is a contaminant     Physical Exam   Triage Vital Signs: ED Triage Vitals [07/01/23 1707]  Encounter Vitals Group     BP (!) 141/90     Systolic BP Percentile      Diastolic BP Percentile      Pulse Rate 88     Resp 14     Temp 98.6 F (37 C)     Temp Source Oral     SpO2 98 %     Weight      Height 1.575 m (5\' 2" )     Head Circumference      Peak Flow      Pain Score 0     Pain Loc      Pain Education      Exclude from Growth Chart     Most recent vital signs: Vitals:   07/01/23 1707  BP: (!) 141/90  Pulse: 88  Resp: 14  Temp: 98.6 F (37 C)  SpO2: 98%     General: Awake, no distress.  CV:  Good peripheral perfusion.  Resp:  Normal effort.  Abd:  No distention.  No CVA tenderness Other:     ED Results / Procedures / Treatments   Labs (all labs ordered are listed, but only abnormal results are displayed) Labs Reviewed  URINALYSIS, ROUTINE W REFLEX MICROSCOPIC - Abnormal; Notable for the following components:      Result Value   Color, Urine YELLOW (*)    APPearance HAZY (*)    Leukocytes,Ua SMALL (*)    All other components within normal limits  CBC WITH DIFFERENTIAL/PLATELET  BASIC METABOLIC PANEL  POC URINE PREG, ED     EKG     RADIOLOGY     PROCEDURES:  Critical Care performed:   Procedures   MEDICATIONS ORDERED IN ED: Medications  ciprofloxacin (CIPRO) tablet 500 mg (500 mg Oral Given 07/01/23  1810)     IMPRESSION / MDM / ASSESSMENT AND PLAN / ED COURSE  I reviewed the triage vital signs and the nursing notes. Patient's presentation is most consistent with acute illness / injury with system symptoms.  Patient presents with continued urinary frequency after antibiotic usage, suspect treatment failure.  Afebrile well-appearing, no CVA tenderness, nontoxic.  Not consistent with pyelonephritis.  Via shared decision making patient has opted to switch to Cipro twice daily which she reports has worked for her in the past.        FINAL CLINICAL IMPRESSION(S) / ED DIAGNOSES   Final diagnoses:  Lower urinary tract infectious disease     Rx / DC Orders   ED Discharge Orders          Ordered    ciprofloxacin (CIPRO) 500 MG tablet  2 times daily,   Status:  Discontinued        07/01/23 1806    ciprofloxacin (CIPRO) 500 MG tablet  2 times daily  07/01/23 1813             Note:  This document was prepared using Dragon voice recognition software and may include unintentional dictation errors.   Jene Every, MD 07/01/23 [phone]

## 2023-07-01 NOTE — ED Triage Notes (Signed)
 Pt to ed from home via POV for UTI. She was seen by her PCP last week and placed on abx for same for 7 days and advised she doesn't feel better so she came here. Pt is caox4, in no acute distress and ambulatory in triage,

## 2023-12-26 ENCOUNTER — Other Ambulatory Visit: Payer: Self-pay | Admitting: Obstetrics and Gynecology

## 2023-12-28 LAB — SURGICAL PATHOLOGY
